# Patient Record
Sex: Male | Born: 1950 | Race: White | Hispanic: No | Marital: Married | Smoking: Former smoker
Health system: Southern US, Community
[De-identification: ages and names within clinical notes are randomized; demographics above are authoritative.]

## PROBLEM LIST (undated history)

## (undated) DIAGNOSIS — I2510 Atherosclerotic heart disease of native coronary artery without angina pectoris: Secondary | ICD-10-CM

## (undated) DIAGNOSIS — N289 Disorder of kidney and ureter, unspecified: Secondary | ICD-10-CM

## (undated) HISTORY — PX: KIDNEY SURGERY: SHX687

## (undated) HISTORY — PX: BACK SURGERY: SHX140

## (undated) HISTORY — PX: CARDIAC SURGERY: SHX584

---

## 2004-10-25 ENCOUNTER — Observation Stay: Payer: Self-pay | Admitting: Urology

## 2004-11-04 ENCOUNTER — Ambulatory Visit: Payer: Self-pay | Admitting: Urology

## 2004-11-18 ENCOUNTER — Ambulatory Visit: Payer: Self-pay | Admitting: Urology

## 2004-12-08 ENCOUNTER — Encounter: Payer: Self-pay | Admitting: Family Medicine

## 2005-03-01 ENCOUNTER — Ambulatory Visit: Payer: Self-pay | Admitting: Unknown Physician Specialty

## 2005-08-23 ENCOUNTER — Encounter: Payer: Self-pay | Admitting: Family Medicine

## 2005-08-25 ENCOUNTER — Ambulatory Visit: Payer: Self-pay | Admitting: Urology

## 2006-03-23 ENCOUNTER — Encounter: Payer: Self-pay | Admitting: Family Medicine

## 2006-12-07 ENCOUNTER — Ambulatory Visit: Payer: Self-pay | Admitting: Family Medicine

## 2006-12-07 DIAGNOSIS — Z87891 Personal history of nicotine dependence: Secondary | ICD-10-CM | POA: Insufficient documentation

## 2006-12-12 ENCOUNTER — Encounter: Payer: Self-pay | Admitting: Family Medicine

## 2008-03-10 ENCOUNTER — Ambulatory Visit: Payer: Self-pay | Admitting: Urology

## 2008-03-13 ENCOUNTER — Ambulatory Visit: Payer: Self-pay | Admitting: Urology

## 2009-01-07 ENCOUNTER — Ambulatory Visit: Payer: Self-pay | Admitting: Urology

## 2009-08-11 ENCOUNTER — Ambulatory Visit: Payer: Self-pay | Admitting: Family Medicine

## 2009-08-11 DIAGNOSIS — J069 Acute upper respiratory infection, unspecified: Secondary | ICD-10-CM | POA: Insufficient documentation

## 2009-08-11 DIAGNOSIS — F341 Dysthymic disorder: Secondary | ICD-10-CM

## 2009-09-22 ENCOUNTER — Ambulatory Visit: Payer: Self-pay | Admitting: Family Medicine

## 2009-10-18 IMAGING — CT CT ABDOMEN AND PELVIS WITHOUT AND WITH CONTRAST
3 of 6 series · 13 of 32 positions shown, 18 images · non-contrast
Comparison: none

REASON FOR EXAM: kidney stone  RLQ abd pain
COMMENTS:

PROCEDURE:     CT  - CT ABDOMEN / PELVIS  W/WO  - January 07, 2009  [DATE]
RESULT:     Triphasic CT of the abdomen and pelvis, including extended
delays with the patient in a prone position, is compared to previous studies
dated 03/13/2008 as well as 03/10/2008.

[Series 2: soft tissue w/o · axial · non-contrast · 0.78mm/px · z∈[-52,+268]mm · 5 of 96 slices shown, 10 images]
[im 16/96  soft-tissue]
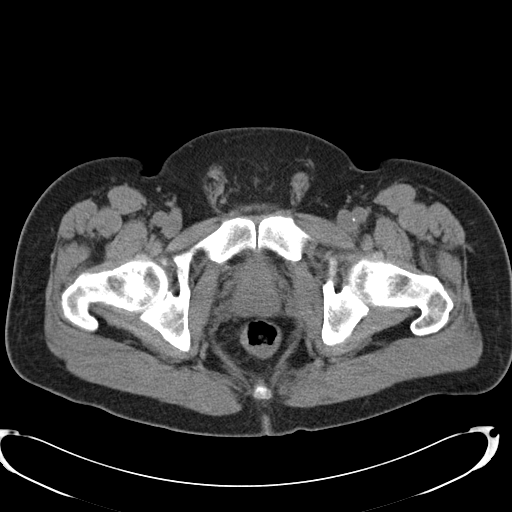
[im 16/96  bone]
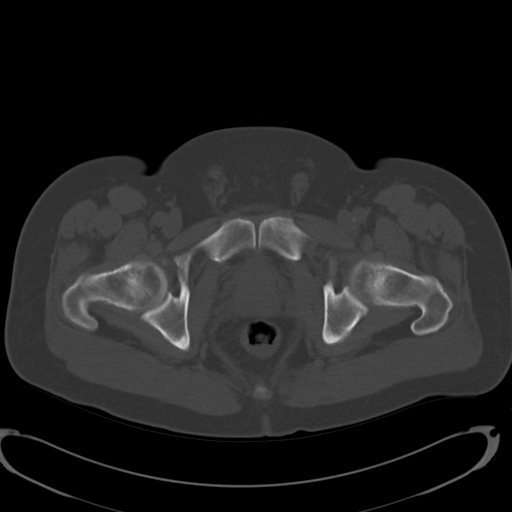
[im 32/96  soft-tissue]
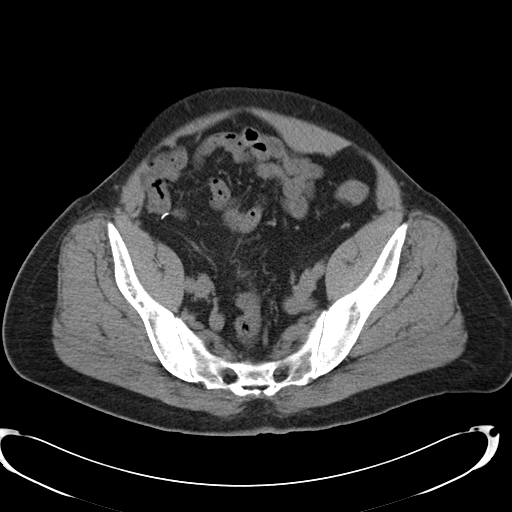
[im 32/96  lung]
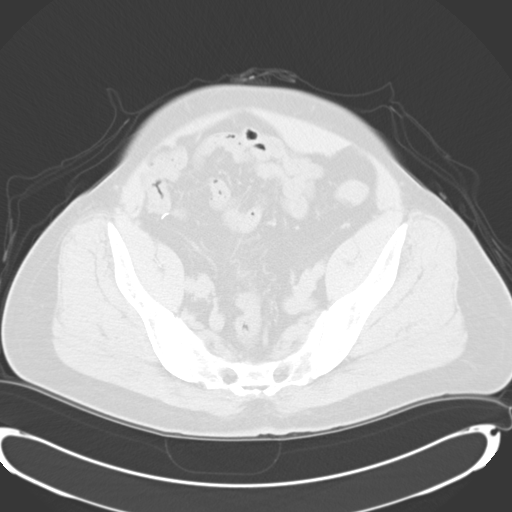
[im 48/96  soft-tissue]
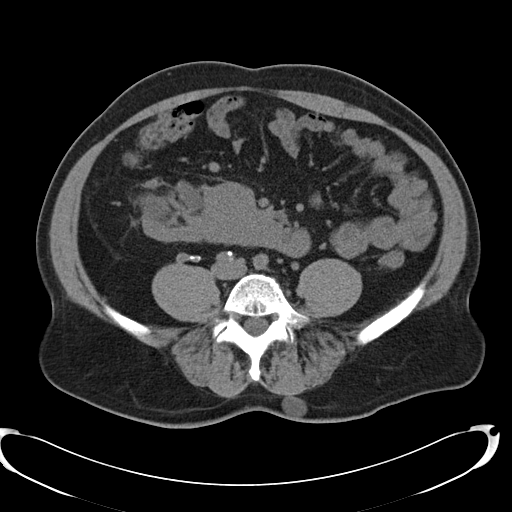
[im 48/96  lung]
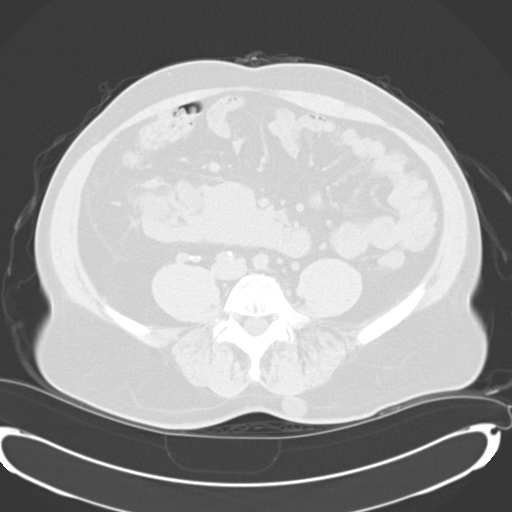
[im 64/96  soft-tissue]
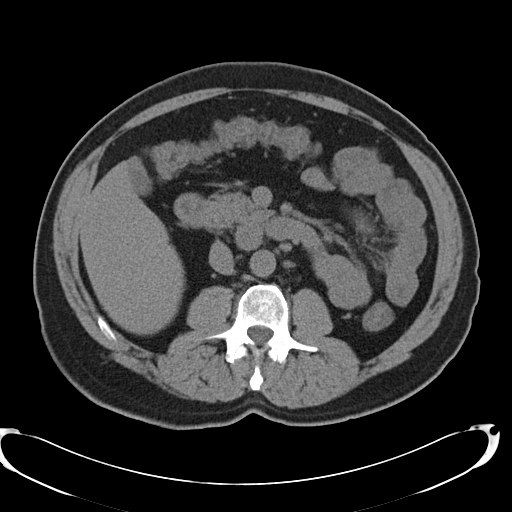
[im 64/96  lung]
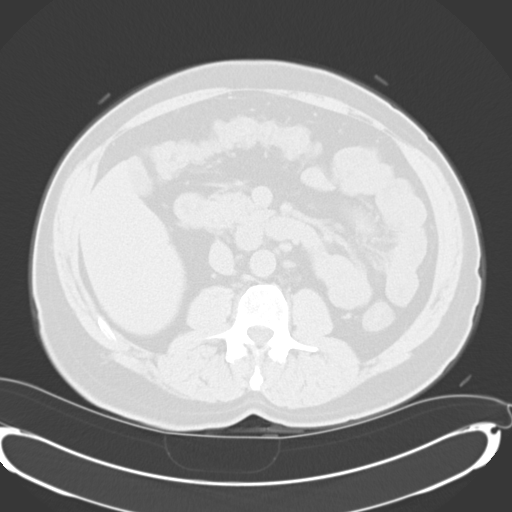
[im 80/96  soft-tissue]
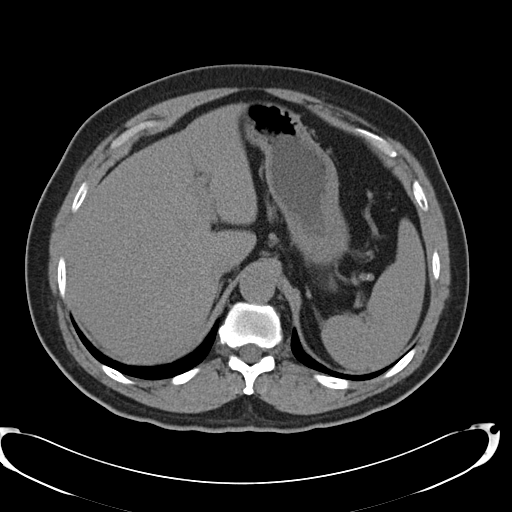
[im 80/96  lung]
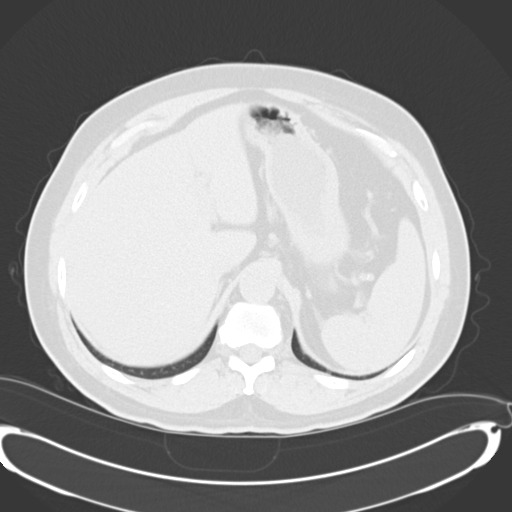

[Series 4: soft tissue with · axial · 0.78mm/px · z∈[-32,+252]mm · 4 of 96 slices shown]
[im 20/96  soft-tissue]
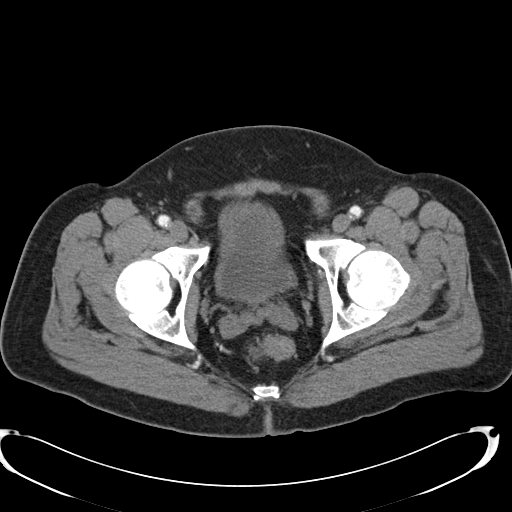
[im 39/96  soft-tissue]
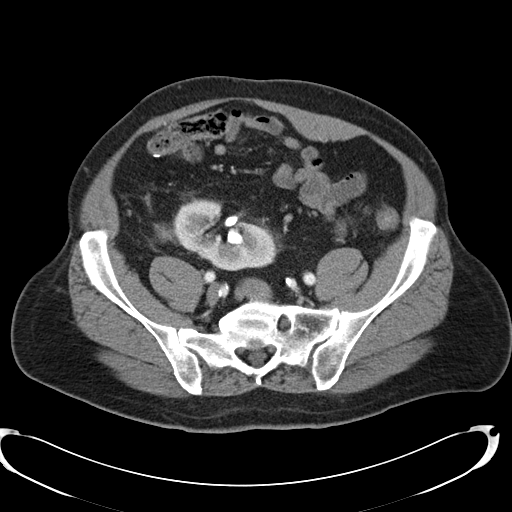
[im 58/96  soft-tissue]
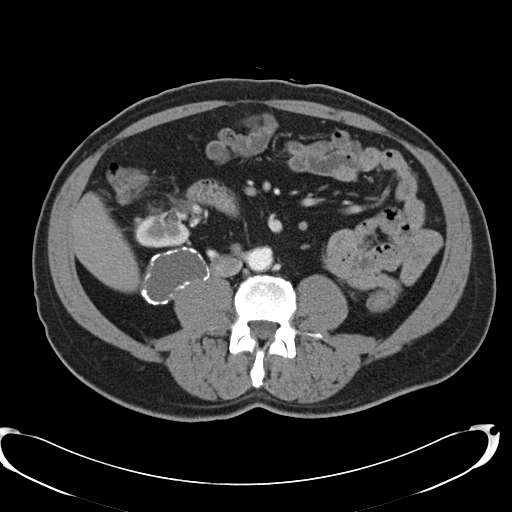
[im 77/96  soft-tissue]
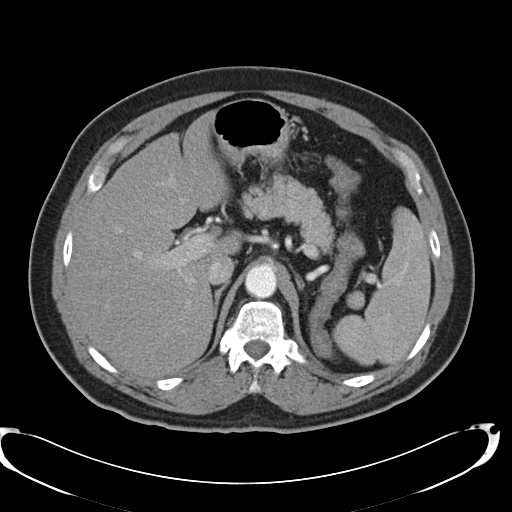

[Series 6: soft tissue delay · axial · delayed · 0.78mm/px · z∈[-32,+252]mm · 4 of 96 slices shown]
[im 20/96  soft-tissue]
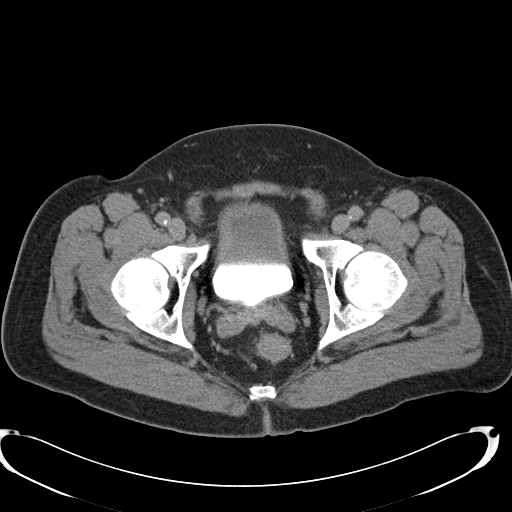
[im 39/96  soft-tissue]
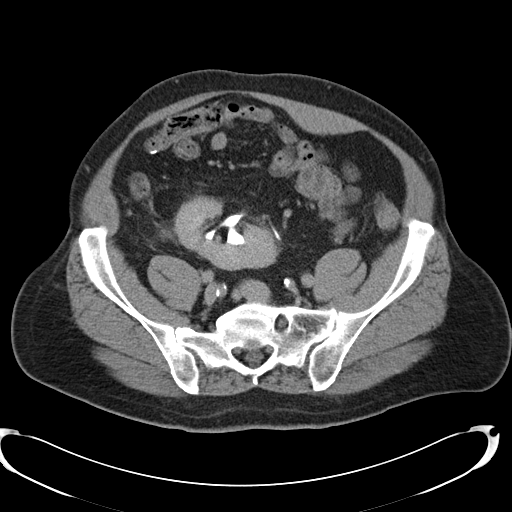
[im 58/96  soft-tissue]
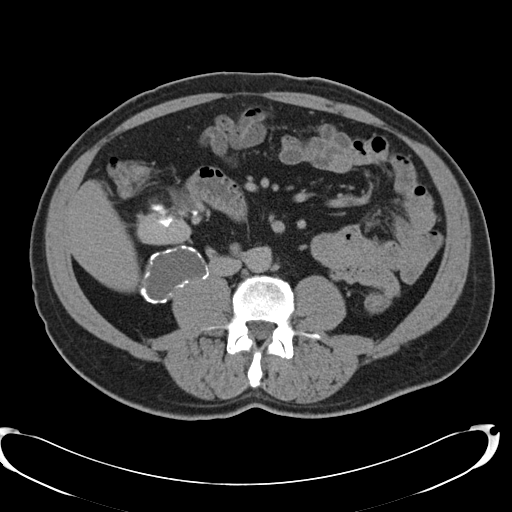
[im 77/96  soft-tissue]
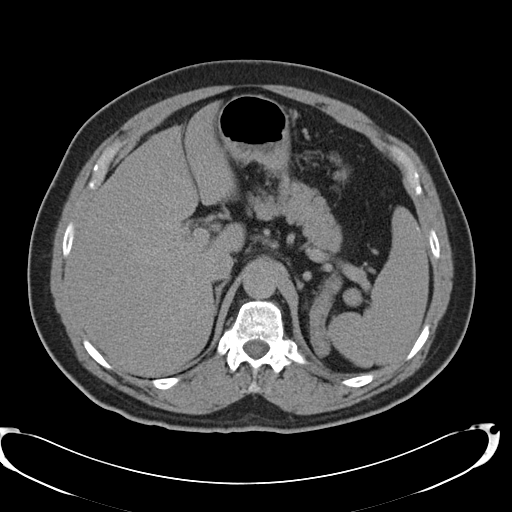

[13 of 32 positions shown; findings below may reference images not displayed]

FINDINGS: The precontrast images demonstrate what appears to be a single
renal moiety in the pelvis representative of possible crossed fused ectopia
or horseshoe variant. There are multiple stones present with the largest
measuring up to approximately 12 x 9 mm in the more inferior posterior
aspect of the renal collecting system to the left. There is mild dilation of
the renal collecting system toward the right. There appear to be separate
ureters. There is no hydroureter or definite ureteral calculus evident. No
bladder calculi are demonstrated. There is a fluid filled peripherally
calcified lesion along the right psoas muscle posterior to the right renal
moiety which is unchanged compared to the previous study. Again, this may
represent a seroma or chronic change from a hematoma. A urinoma is felt to
be less likely. Surgical clips are present in the right abdomen and pelvis
in the region of the right side of the kidney. Granulomatous calcifications
are present within the spleen. The aorta is normal in caliber with some mild
to moderate vascular calcification present.

Images obtained following intravenous administration of 100 ml of Nsovue-Q11
iodinated intravenous contrast demonstrate enhancement by the kidney.
Delayed images show excretion of contrast bilaterally. The right ureter
opacifies poorly but does not appear to be obstructed or definitely
containing a stone. There is some mild thickening of the wall of the sigmoid
colon without definite pericolonic inflammation. Diverticulosis is present.
There is some minimal perirenal stranding especially over the right side of
the kidney. There is no abnormal bowel distention, free fluid or other
abnormal fluid collection. There is no adenopathy. The urinary bladder shows
no evidence of a mass.
IMPRESSION: 1.  Pelvic kidney with an appearance suggestive of crossed fused ectopia. A
horseshoe variant is a possibility. The right renal collecting system shows
mild dilation which may be sequelae from possible ureteropelvic junction
given the previous surgical history. The right ureter is nondistended. There
are no stones in either ureter but there are multiple stones in the kidney
as described. There is some mild stranding around the right side of the
kidney. Correlate for urinary tract infection.
2.  Scattered colonic diverticulosis with some mild thickening of the wall
of the sigmoid colon. This is nonspecific. Correlate clinically. No discrete
mass is evident.
3.  Fluid collection and peripherally calcified rim appears stable anterior
to the right psoas muscle.
4.  Granulomatous changes are present.
5.  The gallbladder is present without definite stones. The gallbladder does
not appear to be significantly distended.

## 2010-01-05 ENCOUNTER — Encounter (INDEPENDENT_AMBULATORY_CARE_PROVIDER_SITE_OTHER): Payer: Self-pay | Admitting: *Deleted

## 2010-06-30 NOTE — Letter (Signed)
Summary: Nadara Eaton letter  [facility]  [address] [place], [place] [address]   Phone: [phone]  Fax: [phone]       01/05/2010 MRN: [id number]  Tommy [name] [address] [place], [place]  [address]  Dear Mr. BLAKEMAN,  [facility] [place], and [facility] announce the retirement of Arta Silence, M.D., from full-time practice at the [facility] office effective November 26, 2009 and his plans of returning part-time.  It is important to Dr. Hetty Ely and to our practice that you understand that [facility] has seven physicians in our office for your health care needs.  We will continue to offer the same exceptional care that you have today.    Dr. Hetty Ely has spoken to many of you about his plans for retirement and returning part-time in the fall.   We will continue to work with you through the transition to schedule appointments for you in the office and meet the high standards that [place] is committed to.   Again, it is with great pleasure that we share the news that Dr. Hetty Ely will return to [facility] in October of 2011 with a reduced schedule.    If you have any questions, or would like to request an appointment with one of our physicians, please call us at [phone] and press the option for Scheduling an appointment.  We take pleasure in providing you with excellent patient care and look forward to seeing you at your next office visit.  Our [facility] Physicians are:  Tillman Abide, M.D. Laurita Quint, M.D. Roxy Manns, M.D. Kerby Nora, M.D. Hannah Beat, M.D. Ruthe Mannan, M.D. We proudly welcomed Raechel Ache, M.D. and Eustaquio Boyden, M.D. to the practice in July/August 2011.  Sincerely,  [facility]

## 2010-06-30 NOTE — Assessment & Plan Note (Signed)
Summary: head,body aches/ alc   Vital Signs:  Patient profile:   60 year old male Height:      69.75 inches Weight:      209.75 pounds BMI:     30.42 Temp:     98.6 degrees F oral Pulse rate:   88 / minute Pulse rhythm:   regular BP sitting:   114 / 78  (left arm) Cuff size:   regular  Vitals Entered By: Sydell Axon LPN (August 11, 2009 10:35 AM) CC: Head and body aches, needle pains in feet, unable to sleep, under a lot of stress, lost his job and unable to find one   History of Present Illness: Pt here for depression. He has lost his job. That happened in Nov. He has continued to look for a job without luck,. He has five children, all out of the home. He passes lots of gas. His guts are all messed up. He woke himself up a few days ago crying in his sleep. He is not sleeping well. He is able to talk with his wife.   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 1 3 or 4 Times A Week 2)  Chantix Starting Month Pak 0.5 Mg X 11 & 1 Mg X 42  Misc (Varenicline Tartrate) .... As Directed 3)  Chantix 1 Mg  Tabs (Varenicline Tartrate) .... One Tab By Mouth Bid  Allergies: No Known Drug Allergies  Physical Exam  General:  Well-developed,well-nourished,in no acute distress; alert,appropriate and cooperative throughout examination Head:  Normocephalic and atraumatic without obvious abnormalities. No apparent alopecia or balding. Sinuses NT. Eyes:  Conjunctiva clear bilaterally.  Ears:  External ear exam shows no significant lesions or deformities.  Otoscopic examination reveals clear canals, tympanic membranes are intact bilaterally without bulging, retraction, inflammation or discharge. Hearing is grossly normal bilaterally. Nose:  External nasal examination shows no  deformity or inflammation. Nasal mucosa are pink and moist without lesions or exudates. Mouth:  Oral mucosa and oropharynx without lesions or exudates.  Teeth in good repair. Neck:  No deformities, masses, or tenderness noted. Lungs:  Normal respiratory effort, chest expands symmetrically. Lungs are clear to auscultation, no crackles or wheezes. Heart:  Normal rate and regular rhythm. S1 and S2 normal without gallop, murmur, click, rub or other extra sounds. Abdomen:  Bowel sounds positive,abdomen soft and non-tender without masses, organomegaly or hernias noted. Psych:  Cognition and judgment appear intact. Alert and cooperative with normal attention span and concentration. No apparent delusions, illusions, hallucinations, mild flat affect. Denies SI, HI.   Impression & Recommendations:  Problem # 1:  DEPRESSION/ANXIETY (ICD-300.4) Assessment New Start regular aerobic exercise, wotrking up to  one hour causing deep breathing  and sweating. Use sleep hygiene techniques discussed and on chart. If needed, start Zoloft 50mg , 1/2 a day for one week then whokle pill.  Problem # 2:  URI (ICD-465.9) Assessment: New  See instructions.  Instructed on symptomatic treatment. Call if symptoms persist or worsen.   Complete Medication List: 1)  Centrum Silver Tabs (Multiple vitamins-minerals) .... Take one by mouth daily 2)  Zoloft 50 Mg Tabs (Sertraline hcl) .... 1./2 tab by mouth once daily for one week, then incr to whole pill daily.  Patient Instructions: 1)  Take Guaifenesin by [facility] or [facility] and getting MUCOUS RELIEF EXPECTORANT (400mg ), take 11/2 tabs by mouth AM and NOON. 2)  Drink lots of fluids anytime taking Guaifenesin. 3)  Take Tyl Es 2 tabs by mouth three times a day regularly for a week or more. 4)  Keep lozenge in  mouth. 5)  RTC 6 weeks. 6)  Start Sertraline as needed for anxiety/depression. Prescriptions: ZOLOFT 50 MG TABS (SERTRALINE HCL) 1./2  tab by mouth once daily for one week, then incr to whole pill daily.  #30 x 12   Entered and Authorized by:   Shaune Leeks MD   Signed by:   Shaune Leeks MD on 08/11/2009   Method used:   [facility]ronically to        [facility] [employer] [phone]* (retail)       [address] [place], [place]  [address]       Ph: [phone] or [phone]       Fax: [phone]   RxID:   [id number]   Current Allergies (reviewed today): No known allergies

## 2011-02-14 ENCOUNTER — Emergency Department: Payer: Self-pay | Admitting: Emergency Medicine

## 2011-08-18 ENCOUNTER — Ambulatory Visit: Payer: Self-pay | Admitting: Sports Medicine

## 2011-08-19 ENCOUNTER — Ambulatory Visit: Payer: Self-pay | Admitting: Unknown Physician Specialty

## 2011-08-19 LAB — URINALYSIS, COMPLETE
Bacteria: NONE SEEN
Glucose,UR: NEGATIVE mg/dL (ref 0–75)
Leukocyte Esterase: NEGATIVE
Nitrite: NEGATIVE
RBC,UR: 12 /HPF (ref 0–5)
Specific Gravity: 1.016 (ref 1.003–1.030)
WBC UR: 1 /HPF (ref 0–5)

## 2011-08-19 LAB — BASIC METABOLIC PANEL
Anion Gap: 6 — ABNORMAL LOW (ref 7–16)
BUN: 12 mg/dL (ref 7–18)
Chloride: 102 mmol/L (ref 98–107)
Co2: 30 mmol/L (ref 21–32)
Creatinine: 0.98 mg/dL (ref 0.60–1.30)
EGFR (African American): >60
EGFR (Non-African Amer.): >60
Glucose: 96 mg/dL (ref 65–99)
Osmolality: 275 (ref 275–301)
Sodium: 138 mmol/L (ref 136–145)

## 2011-08-19 LAB — CBC
HCT: 44 % (ref 40.0–52.0)
MCHC: 33.2 g/dL (ref 32.0–36.0)
MCV: 94 fL (ref 80–100)
RDW: 14 % (ref 11.5–14.5)

## 2011-08-19 LAB — MRSA PCR SCREENING

## 2011-08-22 ENCOUNTER — Inpatient Hospital Stay: Payer: Self-pay | Admitting: Orthopedic Surgery

## 2011-08-22 LAB — BASIC METABOLIC PANEL
Anion Gap: 7 (ref 7–16)
BUN: 11 mg/dL (ref 7–18)
Chloride: 106 mmol/L (ref 98–107)
Creatinine: 1.21 mg/dL (ref 0.60–1.30)
EGFR (African American): >60
EGFR (Non-African Amer.): >60
Glucose: 203 mg/dL — ABNORMAL HIGH (ref 65–99)
Osmolality: 288 (ref 275–301)
Potassium: 4.6 mmol/L (ref 3.5–5.1)
Sodium: 142 mmol/L (ref 136–145)

## 2011-08-22 LAB — CBC WITH DIFFERENTIAL/PLATELET
Basophil #: 0 10*3/uL (ref 0.0–0.1)
Eosinophil #: 0.1 10*3/uL (ref 0.0–0.7)
Lymphocyte #: 0.8 10*3/uL — ABNORMAL LOW (ref 1.0–3.6)
MCH: 31.6 pg (ref 26.0–34.0)
Monocyte #: 0.5 10*3/uL (ref 0.0–0.7)
Monocyte %: 5.2 %
Neutrophil #: 7.5 10*3/uL — ABNORMAL HIGH (ref 1.4–6.5)
Neutrophil %: 84.8 %
RDW: 14.3 % (ref 11.5–14.5)
WBC: 8.8 10*3/uL (ref 3.8–10.6)

## 2011-08-22 LAB — DRUG SCREEN, URINE
Amphetamines, Ur Screen: NEGATIVE (ref ?–1000)
Barbiturates, Ur Screen: NEGATIVE (ref ?–200)
Benzodiazepine, Ur Scrn: POSITIVE (ref ?–200)
Cannabinoid 50 Ng, Ur ~~LOC~~: POSITIVE (ref ?–50)
Cocaine Metabolite,Ur ~~LOC~~: NEGATIVE (ref ?–300)
Methadone, Ur Screen: NEGATIVE (ref ?–300)
Opiate, Ur Screen: POSITIVE (ref ?–300)
Phencyclidine (PCP) Ur S: NEGATIVE (ref ?–25)
Tricyclic, Ur Screen: NEGATIVE (ref ?–1000)

## 2011-08-22 LAB — HEMOGLOBIN A1C: Hemoglobin A1C: 7.1 % — ABNORMAL HIGH (ref 4.2–6.3)

## 2011-08-22 LAB — PROTIME-INR: INR: 1.1

## 2011-08-22 LAB — TROPONIN I: Troponin-I: <0.02 ng/mL

## 2011-08-22 LAB — CK TOTAL AND CKMB (NOT AT ARMC)
CK, Total: 119 U/L (ref 35–232)
CK, Total: 158 U/L (ref 35–232)
CK-MB: 1.7 ng/mL (ref 0.5–3.6)

## 2011-08-23 LAB — CK TOTAL AND CKMB (NOT AT ARMC): CK-MB: 2.8 ng/mL (ref 0.5–3.6)

## 2011-08-23 LAB — TROPONIN I: Troponin-I: 0.21 ng/mL — ABNORMAL HIGH

## 2011-08-23 LAB — LIPID PANEL
HDL Cholesterol: 38 mg/dL — ABNORMAL LOW (ref 40–60)
Triglycerides: 110 mg/dL (ref 0–200)

## 2011-08-24 LAB — CBC WITH DIFFERENTIAL/PLATELET
Basophil #: 0 10*3/uL (ref 0.0–0.1)
Eosinophil #: 0 10*3/uL (ref 0.0–0.7)
Eosinophil %: 0 %
HGB: 11.6 g/dL — ABNORMAL LOW (ref 13.0–18.0)
Lymphocyte #: 0.5 10*3/uL — ABNORMAL LOW (ref 1.0–3.6)
MCH: 31.4 pg (ref 26.0–34.0)
MCV: 95 fL (ref 80–100)
Monocyte #: 0.2 10*3/uL (ref 0.0–0.7)
Monocyte %: 2.4 %
Neutrophil #: 8.6 10*3/uL — ABNORMAL HIGH (ref 1.4–6.5)
Neutrophil %: 92 %
RBC: 3.71 10*6/uL — ABNORMAL LOW (ref 4.40–5.90)
RDW: 14.7 % — ABNORMAL HIGH (ref 11.5–14.5)

## 2011-08-24 LAB — BASIC METABOLIC PANEL
Anion Gap: 13 (ref 7–16)
BUN: 23 mg/dL — ABNORMAL HIGH (ref 7–18)
Chloride: 109 mmol/L — ABNORMAL HIGH (ref 98–107)
Creatinine: 1.09 mg/dL (ref 0.60–1.30)
EGFR (African American): >60
EGFR (Non-African Amer.): >60
Glucose: 169 mg/dL — ABNORMAL HIGH (ref 65–99)
Osmolality: 291 (ref 275–301)
Sodium: 142 mmol/L (ref 136–145)

## 2011-08-24 LAB — MAGNESIUM
Magnesium: 1.5 mg/dL — ABNORMAL LOW
Magnesium: 2 mg/dL

## 2011-08-24 LAB — PHOSPHORUS: Phosphorus: 2.3 mg/dL — ABNORMAL LOW (ref 2.5–4.9)

## 2011-08-25 LAB — CBC WITH DIFFERENTIAL/PLATELET
Basophil #: 0 10*3/uL (ref 0.0–0.1)
Eosinophil #: 0 10*3/uL (ref 0.0–0.7)
Lymphocyte #: 0.3 10*3/uL — ABNORMAL LOW (ref 1.0–3.6)
Lymphocyte %: 3.6 %
MCH: 31.6 pg (ref 26.0–34.0)
MCHC: 33.4 g/dL (ref 32.0–36.0)
MCV: 95 fL (ref 80–100)
Monocyte %: 9.6 %
Neutrophil %: 86.8 %
RBC: 3.56 10*6/uL — ABNORMAL LOW (ref 4.40–5.90)
RDW: 14.5 % (ref 11.5–14.5)
WBC: 8.8 10*3/uL (ref 3.8–10.6)

## 2011-08-25 LAB — BASIC METABOLIC PANEL
BUN: 27 mg/dL — ABNORMAL HIGH (ref 7–18)
Calcium, Total: 7.8 mg/dL — ABNORMAL LOW (ref 8.5–10.1)
Co2: 20 mmol/L — ABNORMAL LOW (ref 21–32)
Creatinine: 0.85 mg/dL (ref 0.60–1.30)
EGFR (African American): >60
EGFR (Non-African Amer.): >60
Potassium: 4.4 mmol/L (ref 3.5–5.1)
Sodium: 144 mmol/L (ref 136–145)

## 2011-08-25 LAB — MAGNESIUM: Magnesium: 2.1 mg/dL

## 2011-08-27 LAB — BASIC METABOLIC PANEL
BUN: 16 mg/dL (ref 7–18)
Calcium, Total: 8.2 mg/dL — ABNORMAL LOW (ref 8.5–10.1)
Chloride: 107 mmol/L (ref 98–107)
Co2: 28 mmol/L (ref 21–32)
Creatinine: 0.89 mg/dL (ref 0.60–1.30)
EGFR (African American): >60
Glucose: 110 mg/dL — ABNORMAL HIGH (ref 65–99)
Osmolality: 289 (ref 275–301)
Potassium: 3.3 mmol/L — ABNORMAL LOW (ref 3.5–5.1)
Sodium: 144 mmol/L (ref 136–145)

## 2011-10-06 ENCOUNTER — Ambulatory Visit: Payer: Self-pay | Admitting: Otolaryngology

## 2011-12-13 ENCOUNTER — Ambulatory Visit: Payer: Self-pay | Admitting: Urology

## 2011-12-19 ENCOUNTER — Ambulatory Visit: Payer: Self-pay | Admitting: Urology

## 2012-01-26 ENCOUNTER — Ambulatory Visit: Payer: Self-pay | Admitting: Urology

## 2012-02-08 ENCOUNTER — Ambulatory Visit: Payer: Self-pay | Admitting: Urology

## 2012-03-16 ENCOUNTER — Ambulatory Visit: Payer: Self-pay | Admitting: Urology

## 2012-07-15 ENCOUNTER — Emergency Department: Payer: Self-pay | Admitting: Emergency Medicine

## 2012-12-21 ENCOUNTER — Ambulatory Visit: Payer: Self-pay

## 2013-09-02 ENCOUNTER — Ambulatory Visit: Payer: Self-pay | Admitting: Otolaryngology

## 2013-11-18 IMAGING — CR DG ABDOMEN 1V
1 series · 2 of 2 positions shown · non-contrast
Comparison: none

REASON FOR EXAM: Calculus
COMMENTS:

[Series 1: t abdomen supine · 0.14mm/px · 2 of 2 slices shown]
[im 1/2]
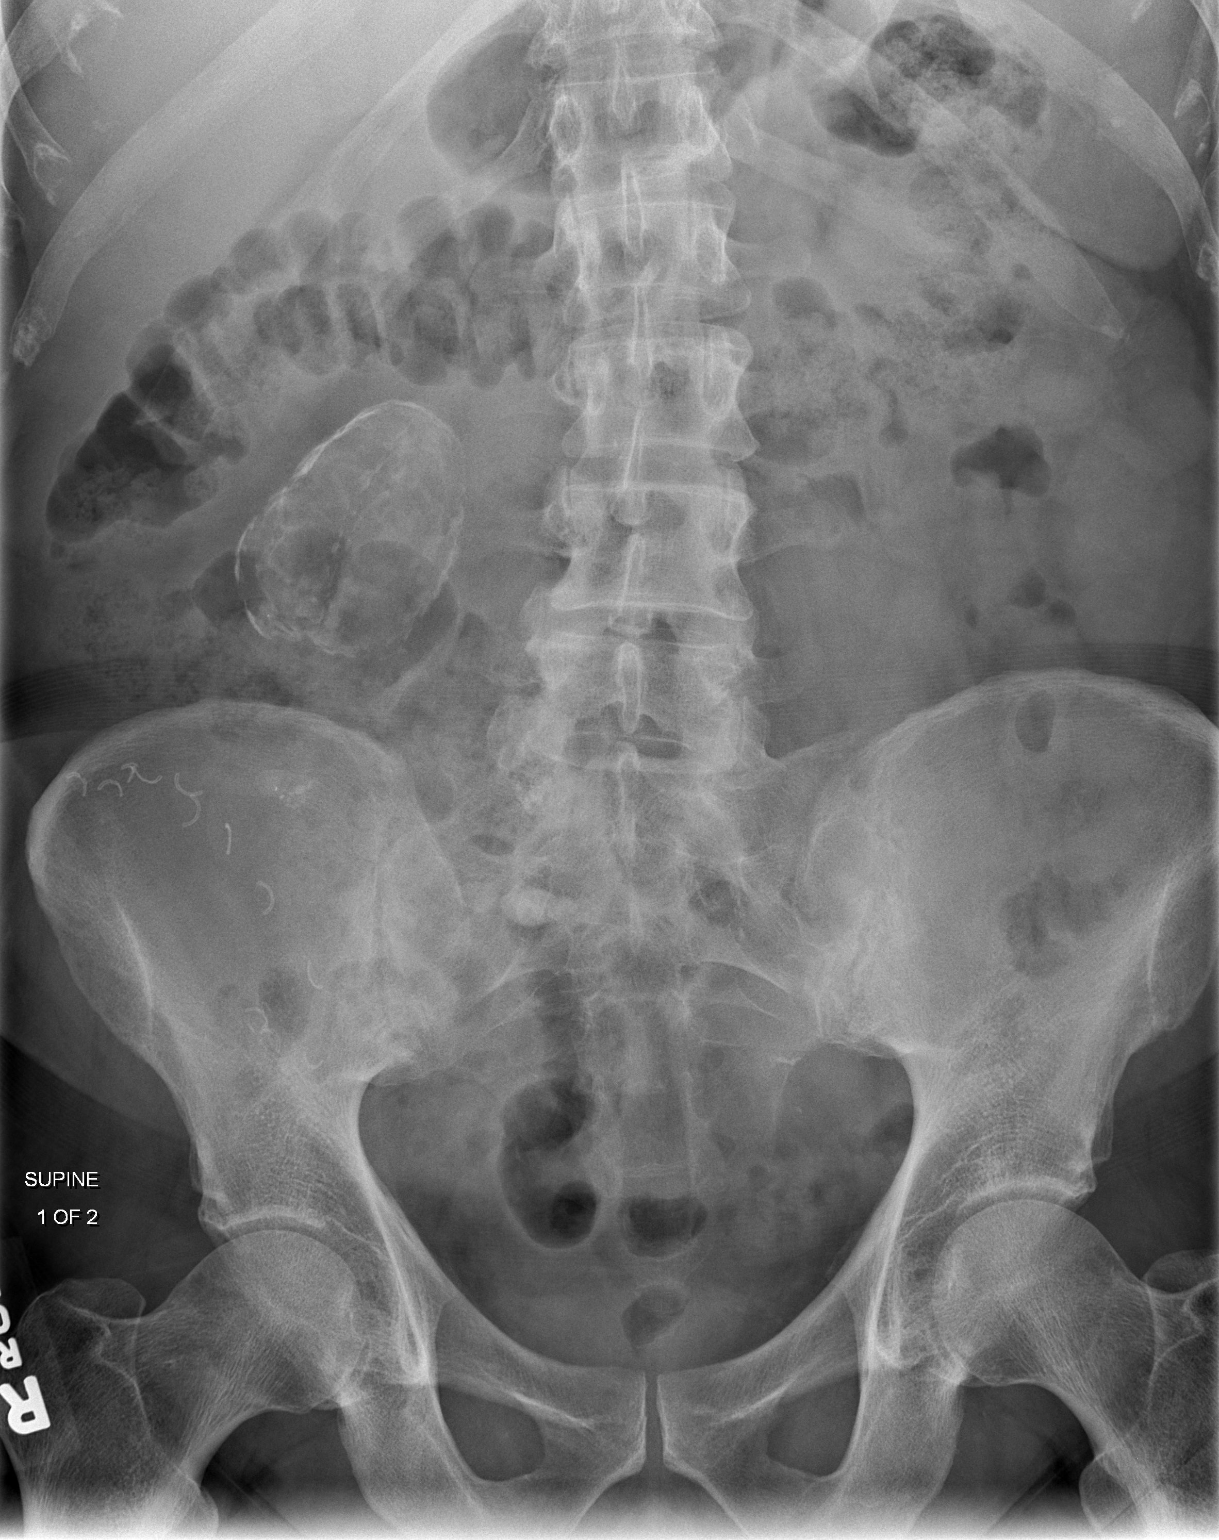
[im 2/2]
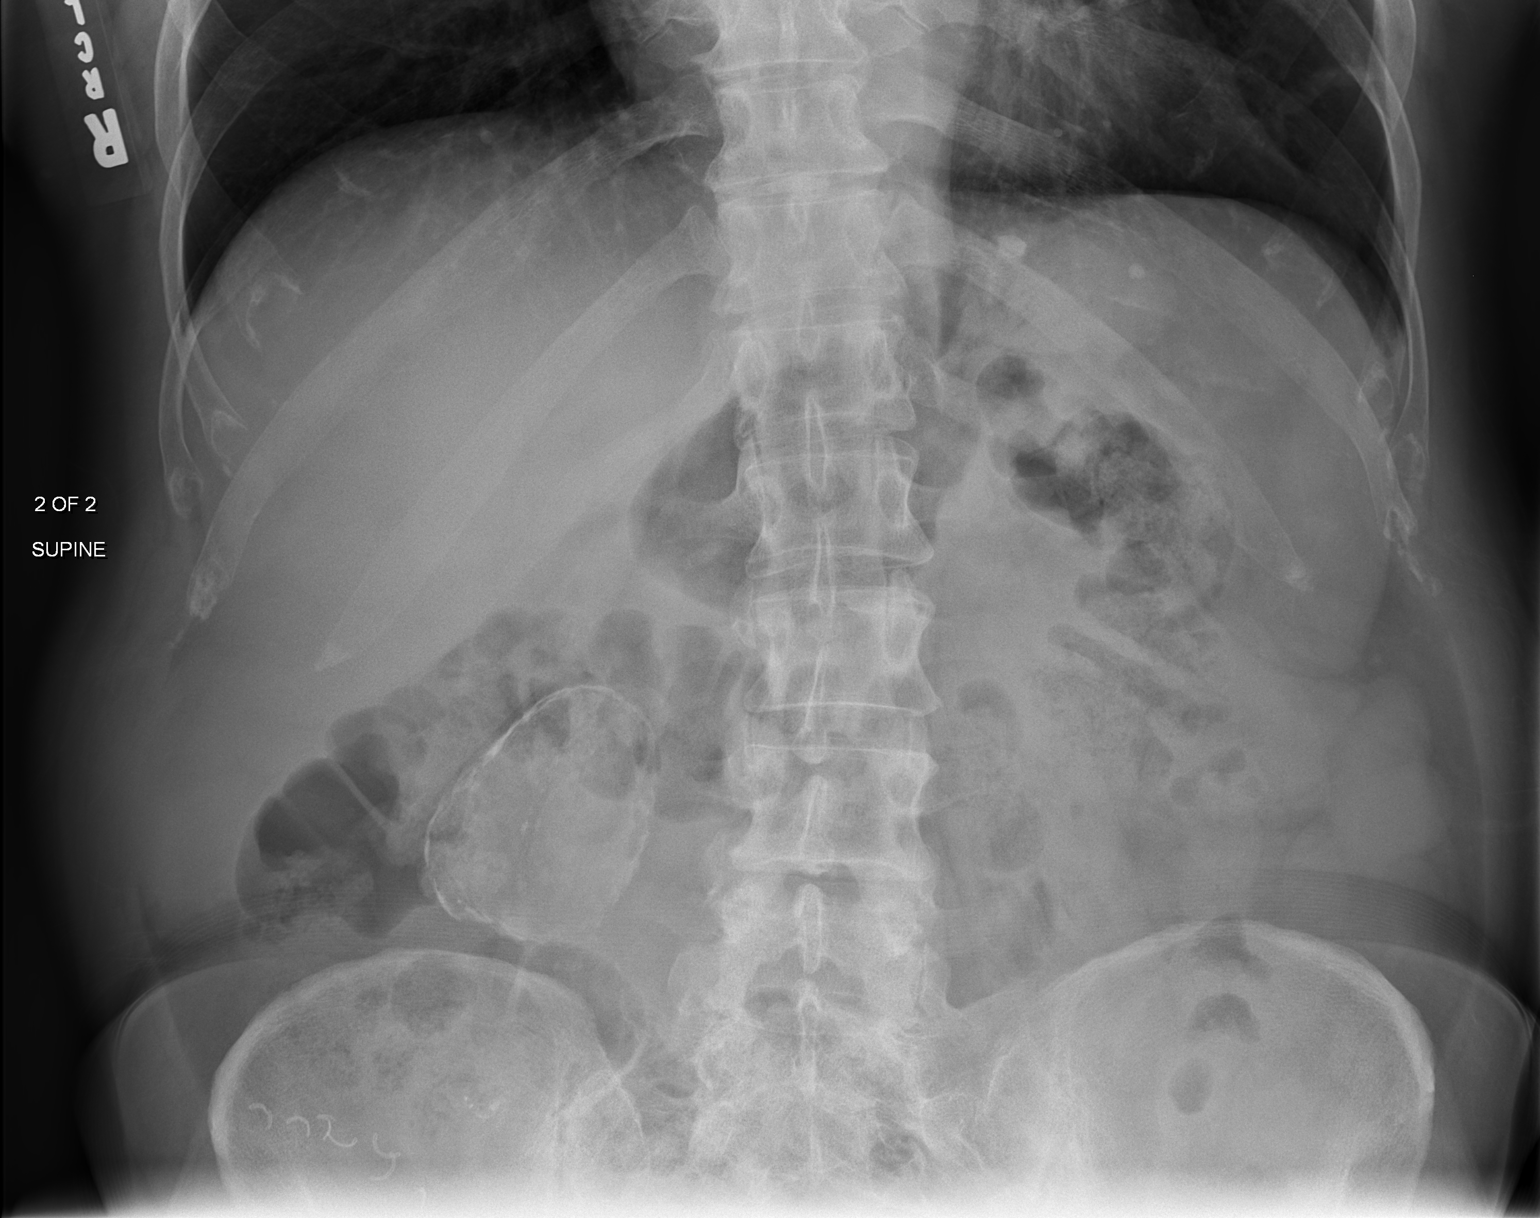

[2 of 2 positions shown; findings below may reference images not displayed]

PROCEDURE:     DXR - DXR KIDNEY URETER BLADDER  - January 26, 2012 [DATE]

RESULT:     Comparison is made to a CT of the abdomen and pelvis dated December 13, 2011 and to a previous KUB performed on December 19, 2011.

An elliptical area of peripheral calcification projects just above the right
iliac crest and may represent an old peripherally calcified hematoma or
seroma. Chronic abscess is felt to be unlikely. The patient has a known
single kidney in the right side of the pelvis to mid pelvic region with
extensive calcifications consistent with stones. This has an appearance
suggestive of crossed-fused renal ectopia on a previous CT. Surgical clips
are seen in the right pelvis. The bowel gas pattern is unremarkable. The
calcifications seen on CT are less well demonstrated on the radiograph but
there are calcifications projecting over the right sacrum and sacroiliac
region that appear to be consistent with the previously demonstrated
nephrolithiasis.
IMPRESSION: 1.     Stable appearance.
2.     Single pelvic kidney with calcifications consistent with large
stones.
3.     Probable peripherally calcified hematoma or seroma in the right
abdominal region.

[REDACTED]

## 2014-07-23 ENCOUNTER — Ambulatory Visit: Payer: Self-pay | Admitting: Otolaryngology

## 2014-09-21 NOTE — Consult Note (Signed)
PATIENT NAME:  Tommy Tommy [name], Tommy Tommy [name] MR#:  Tommy [name] DATE OF BIRTH:  1951-05-26  DATE OF CONSULTATION:  08/22/2011  REFERRING PHYSICIAN:  Dr. Belia Tommy [name]  CONSULTING PHYSICIAN:  Tommy Ruddreighton C. Jeriyah Granlund, MD  REASON FOR CONSULTATION: Acute respiratory failure.   HISTORY OF PRESENT ILLNESS: Patient is Tommy [name] with history of chronic back problems who underwent lumbar laminectomy and in postoperative PAC-U patient became stridorous after extubation and experienced cardiac arrest. Cardiopulmonary resuscitation and emergent intubation was performed. Reintubation was significantly difficult and required multiple attempts with Tommy [name] very limited view with the glide laryngoscope. I was called and asked for evaluation of his airway given the unknown reason for his stridulous airway following procedure.   PAST MEDICAL HISTORY:  1. Chronic back pain. 2. Nephrolithiasis. 3. Tobacco abuse.   PAST SURGICAL HISTORY: Lumbar laminectomy.   SOCIAL HISTORY: Patient has Tommy [name] history of tobacco use. Denies any significant alcohol use.   FAMILY HISTORY: Noncontributory.   ALLERGIES: No known drug allergies.   CURRENT MEDICATIONS: Reviewed and documented in the electronic medical record.   PHYSICAL EXAMINATION:  GENERAL: He is sedated and intubated. ET tube is secure.   HEENT: Oral cavity and oropharynx reveals no evidence of active bleeding. Endotracheal tube is in place and secure.   NECK: Neck reveals good thyroid landmarks. There is no evidence of any air or fluid collection in the neck.   HEART: Regular rate and rhythm.   IMPRESSION: 64 year old male with history of chronic back pain and stridor following Tommy [name] laminectomy who went into cardiac arrest with Tommy [name] difficult airway.   PLAN: I discussed with patient's wife as well as Dr. Belia Tommy [name] regarding the patient's respiratory failure as well as difficult airway. I have recommended giving Tommy [name] few days of steroids for reduction of any swelling and edema from Tommy [name] difficult  intubation as well as Tommy [name] difficult reintubation and then an airway evaluation on Thursday. I discussed the benefits as well as the risks of this including Tommy [name] possible emergent tracheostomy tube placement if patient fails extubation again. Also discussed with the wife that if the patient continues to have significant amount of edema I would hold off and do Tommy [name] repeat evaluation on 04/01 with Tommy [name] possible trach at that time. Patient's wife demonstrates understanding and we will proceed with planning suspension microlaryngoscopy with evaluation of his airway on Thursday, 08/25/2011.   ____________________________ Tommy Ruddreighton C. Sharnita Bogucki, MD ccv:cms D: 08/22/2011 15:29:38 ET T: 08/22/2011 16:42:19 ET JOB#: [id number]  cc: Tommy Ruddreighton C. Kizer Nobbe, MD, <Dictator>  Tommy RuddREIGHTON C Amelia Burgard MD ELECTRONICALLY SIGNED 08/29/2011 9:58

## 2014-09-21 NOTE — Op Note (Signed)
PATIENT NAME:  Tommy [name], Tommy [name] MR#:  [id number] DATE OF BIRTH:  [birth year]  DATE OF PROCEDURE:  08/25/2011  PREOPERATIVE DIAGNOSIS: Respiratory failure.   POSTOPERATIVE DIAGNOSIS: Respiratory failure.   PROCEDURE PERFORMED: Suspension microlaryngoscopy with extubation in OR.   SURGEON: Kyung Ruddreighton C. Madi Bonfiglio, MD  ANESTHESIA: General endotracheal.   ESTIMATED BLOOD LOSS: Zero.   IV FLUIDS: Please see anesthesia record.   COMPLICATIONS: None.   DRAINS/STENT PLACEMENTS: None.   SPECIMENS: None.   INDICATIONS FOR PROCEDURE: Patient is [name] who underwent laminectomy on 08/22/2011. In the PAC-U patient had acute onset of stridor and respiratory compromise and [name] CODE was placed and [name] difficult intubation was made and patient underwent several days of IV steroids and patient presents today for evaluation under anesthesia with suspension microlaryngoscopy with possible extubation.   OPERATIVE FINDINGS: Mildly edematous epiglottis as well as arytenoids but exposable airway with anterior commissure scope as well as posterior airway with [name] Dedo laryngoscope. Airway in the area of the cords as well as the subglottic region was inspected with [name] 0 degree Hopkins rod and no significant abnormalities were identified. Airway was widely patent. Patient was extubated without complications.   DESCRIPTION OF PROCEDURE: After the patient was identified in the unit, benefits and risks of the procedure were discussed and consent was reviewed. Patient was taken to the Operating Room and moved from [name] Critical Care Unit bed to the Operating Room bed. He was rotated 90 degrees. [name] shoulder roll was placed and after infusion of propofol [name] Dedo laryngoscope was inserted into patient's oral cavity after placement of [name] tooth guard and this was without injury to teeth, lip, or gums. Visualization of patient's airway was made. This revealed [name] mildly edematous and mildly floppy epiglottis. Posterior arytenoids  were able to be identified as well as posterior aspect of his larynx. At this time Dedo laryngoscope was removed from the patient's oral cavity and an anterior commissure laryngoscope was inserted and this allowed easy evaluation of the patient's true vocal folds and anterior airway as well as subglottic airway. [name] 0 degree Hopkins rod was used for evaluation of the supraglottis and subglottis which revealed no obstruction and just [name] mildly erythematous and edematous epiglottis. At this time the laryngoscope was removed from patient's oral cavity and the patient was watched until sufficient anesthesia had worn off and the endotracheal tube was withdrawn. Patient tolerated this procedure well and was taken to PAC-U in good condition and verbalizing and sating 99% to 100% on oxygen.   ____________________________ Kyung Ruddreighton C. Pranshu Lyster, MD ccv:cms D: 08/25/2011 16:50:42 ET T: 08/25/2011 17:32:58 ET JOB#: [id number]  cc: Kyung Ruddreighton C. Thai Hemrick, MD, <Dictator> Kyung RuddREIGHTON C Jamya Starry MD ELECTRONICALLY SIGNED 08/29/2011 9:58

## 2014-09-21 NOTE — Op Note (Signed)
PATIENT NAME:  Tommy [name], [name] MR#:  [id number] DATE OF BIRTH:  [birth year]  DATE OF PROCEDURE:  08/22/2011  PREOPERATIVE DIAGNOSIS: Herniated disk L5-S1, right side, with severe radiculopathy.   POSTOPERATIVE DIAGNOSIS:  Herniated disk L5-S1, right side, with severe radiculopathy with large free fragment.  PROCEDURE: Right L5-S1 microdiskectomy, partial facetectomy and foraminotomy.   SURGEON: Winn JockJames C. Gerrit Heckaliff, MD   ASSISTANT: Cranston Neighborhris Gaines, PA-C   ANESTHESIA: General.   ESTIMATED BLOOD LOSS: 100 mL.  REPLACEMENT: 300 mL crystalloid.   DRAINS: None.   COMPLICATIONS: None in the operating room.   SPECIMEN SENT: None.   BRIEF CLINICAL NOTE AND PATHOLOGY: The patient had severe radicular pain unresponsive to conservative treatment. Work-up showed evidence of a large herniated disk. Options, risks, and benefits were discussed, and he elected to proceed with operative intervention. At the time of the procedure, there was an extremely large free fragment entering into the intervertebral foramen. There was a small compressed opening in the disk space on the right side. There was mild-to-moderate facet overgrowth.   DESCRIPTION OF PROCEDURE: Preop antibiotics, adequate general anesthesia, prone position on Wilson frame, all prominences well padded. Routine prepping and draping. Appropriate timeout was called.   A routine posterior approach was made after location was identified with a spinal needle. The fascia was opened on the right side of the spinous processes and subperiosteal dissection was performed. A self-retaining retractor was placed. The microscope was then brought into the field. The level was again identified. The soft tissue was removed posteriorly. The bur was used to perform a partial hemilaminectomy. A partial facetectomy was performed to decompress laterally. The disk material was readily encountered. It was severely compressing the S1 nerve root. The disk material was  removed, it was inferior to the disk space. The tract was traced up to the opening in the posterior aspect of the annulus. This opening was very small. The incision was irrigated throughout the procedure. Hemostasis was attained with the bipolar cautery.   Lateral fluoroscopy again was used to confirm position. The exposed dura was covered with a layer of Surgiflo. The fascia was closed with #2 quill, subcutaneous closed with 0 quill, skin with staples and skin glue. A soft sterile dressing was applied. The patient was awakened and taken to the PACU having tolerated the procedure well. Sponge and needle counts were reported as correct prior to and after wound closure.   ____________________________ Winn JockJames C. Gerrit Heckaliff, MD jcc:cbb D: 08/22/2011 12:40:23 ET T: 08/22/2011 12:48:12 ET JOB#: [id number]  cc: Winn JockJames C. Gerrit Heckaliff, MD, <Dictator> Winn JockJAMES C Pallavi Clifton MD ELECTRONICALLY SIGNED 08/23/2011 5:22

## 2014-09-21 NOTE — Discharge Summary (Signed)
PATIENT NAME:  Tommy [name], Oron A MR#:  [id number] DATE OF BIRTH:  1950/12/29  DATE OF ADMISSION:  08/22/2011 DATE OF DISCHARGE:  08/27/2011  ADMITTING DIAGNOSIS: Herniated disc L5-S1, right side, with severe radiculopathy.  POSTOPERATIVE DIAGNOSIS:  Herniated disc L5-S1, right side, with severe radiculopathy with large free fragment.  PROCEDURE:  Right L5-S1 microdiskectomy, partial facetectomy and foraminotomy.  SURGEON:  Ruthann CancerJames Califf, MD  ASSISTANT:  Cranston Neighborhris Jodene Polyak, PA-C  ANESTHESIA:  General.  ESTIMATED BLOOD LOSS:  100 mL.  REPLACEMENT:  300 mL crystalloid.  DRAINS:  None.  COMPLICATIONS:  None in the operating room.  SPECIMENS SENT:  None.  BRIEF CLINICAL NOTE AND PATHOLOGY:  The patient had severe radicular pain that was unresponsive to conservative treatment.  Work-up showed evidence of a large herniated disc.  Options, risks, and benefits were discussed and he elected to proceed with operative intervention. At the time of the procedure there was an extremely large free fragment entering into the intervertebral foramen.  There was a small compressed opening of the disc space on the right side.  There was mild to moderate facet overgrowth.  PHYSICAL EXAMINATION: GENERAL:  Alert, pleasant, in no acute distress.  Very uncomfortable throughout the exam.  He ambulates with an antalgic gait pattern.  HEART:  Regular rate and rhythm.  No murmurs or gallops.  LUNGS:  Clear to auscultation bilaterally.  No wheezes, rales, rhonchi, or rubs.  LUMBOSACRAL SPINE:  No real tenderness to palpation over all the vertebral bodies and facets.  A little tender in the paraspinous muscle area of the lumbar region with no significant paraspinous muscle spasms.  Forward flexion reveals he is very stiff.  He can only get to about the knees with forward flexion and has some discomfort.  Extension about 10 degrees and also discomfort.  Lateral side bend reveals a lot of stiffness.  Rotation is actually within  normal limits.  Gait pattern is also normal.  Reflexes are 1 to 2+ at the patellar tendon on the right, 2+ at the Achilles tendon on the right.  He is 1 to 2+ at the patellar tendon on the left and trace at the Achilles tendon on the left.  He has some weak core strength but nothing focal on examination.  Normal sensation, 2+ pulses distally.  Bilateral hips good internal and external rotation acceptable. No deficits when compared to the contralateral side.  Patricks test is negative.  HOSPITAL COURSE:   After initial admission on 08/22/2011 the patient had surgery that same day.  The patient was taken to the PAC-U and upon extubation the patient became quite stridorous after extubation in the PAC-U postoperatively.   The patient went into a bradycardic cardiac arrest.  Airway was reestablished due to the patient's stridor and respiratory failure.  The patient was stabilized and then moved to the Critical Care Unit where he was treated with steroids and had his labs monitored regularly. On 03/28 the patient was extubated.  The rest of the day he was doing well and continued to remain stable on room air.  He had no shortness of breath.  The patient was moved to the orthopedic floor where the patient remained stable.  On 08/27/2011 the patient was discharged home and was given followup with ENT physician.   CONDITION AT DISCHARGE:  Stable.  DISPOSITION:  The patient was sent home with followup with orthopedics as well as ENT.  DISCHARGE INSTRUCTIONS: 1. The patient may gradually increase weightbearing.  Avoid bending, lifting, and  twisting.  Avoid sitting upright. 2. Diabetic diet as tolerated. 3. Multivitamin once a day. Resume typical home medications. 4. Wound care:  Do not get the dressing bandage wet or dirty.  Call [facility] orthopedics if the dressing gets water under it.  The patient may shower.  Do not let water get under the dressing. 5. Please call the office for any urgent changes in the  patient's health. 6. Patient should walk as much as he can.  Do not lift, push, or pull more than ten pounds currently.  FOLLOW-UP APPOINTMENT:  Call [facility] orthopedics office, [phone], to confirm.  DISCHARGE MEDICATIONS: 1. Percocet 5/325 oral tablet, one tablet orally once every six hours as needed. 2. Albuterol MDI one puff every four to six hours as needed for shortness of breath/wheezing. 3. Chloraseptic spray, 2 sprays every two hours as needed for sore throat.   ____________________________ Evon Slack, PA-C tcg:bjt D: 08/30/2011 16:07:35 ET T: 08/31/2011 11:18:52 ET JOB#: [id number]  cc: Evon Slack, PA-C, <Dictator> Evon Slack [place] ELECTRONICALLY SIGNED 09/12/2011 14:51

## 2014-09-21 NOTE — Consult Note (Signed)
Brief Consult Note: Diagnosis: 1. Acute Resp. Failure 2. Stridor 3. hx of nephrolithasis s/p Lithotripsy 4. s/p Lumbar Laminectomy (L5-S1).   Patient was seen by consultant.   Consult note dictated.   Orders entered.   Discussed with Attending MD.   Comments: 64 yo male w/ hx of chronic back pain, hx of nephrolithasis s/p Lithotripsy, came into hospital for lumbar laminectomy but post-op went into cardiac arrest w/ difficult to get an airway and also bradycardic arrest.   1. Cardiac Arrest - post-operatively in the PACU.  pt. became quite stridorous after extubation and then went into a bradycardic cardiac arrest.  Difficult to get airway but eventually were able to get one.   - did receive some CPR and one dose of epi, atropine.   - Pulmonary consulted (Dr. Belia HemanKasa) and will cont. to follow. 2. Acute REsp. Failure - likely due to stridor in upper airway and etiology unclear.   - started on steriods by Pulm. and cont. weaning as per Pulm. (Dr. Belia HemanKasa) 3. Acidosis - acute resp. acidosis and should improve w/ vent management.  - will follow ABG. 4. Chronic back pain s/p lumbar laminectomy - cont. care as per Ortho.  5. Random BS > 200 - ?? DM.  Will check HemoglobinA1c.  Check lipid profile.  - place on insulin SS as pt. is going to be on Steroids.   Full Code  Job # [id number]  Electronic Signatures: [place] SirenSainani, Chantel Teti J (MD)  (Signed [id number]-Mar-13 14:02)  Authored: Brief Consult Note   Last Updated: 25-Mar-13 14:02 by [place] SirenSainani, Christopher Glasscock J (MD)

## 2014-09-21 NOTE — Consult Note (Signed)
PATIENT NAME:  Tommy Tommy [name], Tommy Tommy [name] MR#:  [id number] DATE OF BIRTH:  06/23/1950  DATE OF CONSULTATION:  08/22/2011  REFERRING PHYSICIAN:  Dr. Belia Tommy [name] and Dr. Gerrit Tommy [name]  CONSULTING PHYSICIAN:  Tommy PancakeVivek J. Tommy KaiserSainani, MD  CHIEF COMPLAINT: Acute respiratory failure.   HISTORY OF PRESENT ILLNESS: This is Tommy [name] who has Tommy [name] history of chronic back problems and history of nephrolithiasis status post lithotripsy who came into the hospital for Tommy [name] lumbar laminectomy. Postoperatively in the PAC-U patient became quite stridorous after extubation and went into Tommy [name] bradycardic cardiac arrest. He was given one dose of IV epinephrine, atropine and also given some cardiopulmonary resuscitation for Tommy [name] while. Airway was finally established and he was admitted to the Intensive Care Unit. Hospitalist services were contacted for medical management. Patient presently is sedated and intubated in the Intensive Care Unit and is hemodynamically stable. His oxygen sats have improved.   REVIEW OF SYSTEMS: Currently unobtainable given the patient's mental status.   PAST MEDICAL HISTORY:  1. Chronic back pain. 2. History of nephrolithiasis status post lithotripsy. 3. History of tobacco abuse.   ALLERGIES: No known drug allergies.   SOCIAL HISTORY: He used to be Tommy [name] smoker. No alcohol abuse. No illicit drug abuse. Lives at home with his wife.   FAMILY HISTORY: Currently unobtainable.   CURRENT MEDICATIONS: Percocet 5/325, 1 tablet q.6 hours as needed.    PHYSICAL EXAMINATION:  GENERAL: This is Tommy [name] critically ill-appearing patient lying in bed, but in no apparent distress.    HEENT: He is atraumatic, normocephalic. His pupils are pinpoint but minimally reactive.   NECK: Supple. There is no jugular venous distention, no bruits, no lymphadenopathy, no thyromegaly.   HEART: Regular rate, rhythm. No murmurs, no rubs, no clicks.   LUNGS: Clear to auscultation bilaterally. No rales, no rhonchi, no wheezes.   ABDOMEN: Soft, flat,  nontender, nondistended. Has good bowel sounds. No hepatosplenomegaly appreciated.   EXTREMITIES: No evidence of any cyanosis, clubbing, or peripheral edema. Has +2 pedal and radial pulses bilaterally.   NEUROLOGIC: He is sedated and intubated. Difficult to do Tommy [name] full Tommy [name] neurological exam.   SKIN: Moist, warm with no rashes.   LYMPHATIC: There is no cervical or axillary lymphadenopathy.   LABORATORY, DIAGNOSTIC AND RADIOLOGICAL DATA: White cell count 8.8, hemoglobin 12.7, hematocrit 37.9, platelet count 223, blood glucose 203, BUN 11, creatinine 1.2, sodium 142, potassium 4.6. Troponin less than 0.02, prothrombin time 14.3, INR 1.1.   ABG done initially prior to him being intubated showed pH 7.1, pCO2 80, pO2 166.   ASSESSMENT AND PLAN: This is Tommy [name] with history of chronic back pain, nephrolithiasis status post lithotripsy presented to the hospital for lumbar laminectomy and postoperatively went into cardiac arrest with respiratory failure.  1. Cardiac arrest. This happened postoperatively in the PAC-U. This was probably related to the fact that patient became quite hypoxic which led his bradycardia, cardiac arrest. Once they were able to get an airway patient has had no further evidence of cardiac arrhythmias. He currently is in normal sinus rhythm and hemodynamically stable off any pressors. His troponins have been negative. As mentioned, he did get some cardiopulmonary resuscitation and one dose of epinephrine and atropine during his cardiac arrest. For now we will follow cardiac markers and follow him hemodynamically.  2. Acute respiratory failure. This was likely secondary to the upper airway stridor. The exact etiology is unclear whether this is an allergic reaction or he has some problem with his upper airway. He has  been empirically started on steroids by pulmonary, we will continue and continue weaning off the ventilator as per pulmonary. Patient likely will benefit from an ENT  consult and one has been placed as he might need Tommy [name] direct view laryngoscopy to evaluate his upper airway.  3. Acidosis. This is acute respiratory acidosis, probably postoperatively as patient was unable to take good deep breaths and was quite stridorous. This should improve with vent management and will follow his serial ABGs.  4. Chronic back pain status post laminectomy. Continue care as per orthopedics at this time.   Thank you so much for the consultation. Will follow along with you.   CODE STATUS: Patient is Tommy [name] FULL CODE.   CRITICAL CARE TIME SPENT: 50 minutes.   ____________________________ Tommy Tommy [name]. Tommy Kaiser, MD vjs:cms D: 08/22/2011 13:52:52 ET T: 08/22/2011 14:26:18 ET JOB#: [id number]  cc: Tommy Tommy [name]. Tommy Kaiser, MD, <Dictator> Tommy Siren MD ELECTRONICALLY SIGNED 08/25/2011 10:47

## 2014-09-21 NOTE — Consult Note (Signed)
Chief Complaint:   Subjective/Chief Complaint patient extubated in OR without difficulty   Assessment/Plan:  Assessment/Plan:   Assessment s/p extubation in OR    Plan Hold on PO until patient more awake then advance slowly.  Rec. sitting upright at 30-40 degrees.  Patient most likely has some OSA and will need a sleep study as outpatient.  Rec. follow up as outpatient in 10-14 days.   Electronic Signatures: Estevan Kersh, Rayfield Citizenreighton Charles (MD)  (Signed [phone]-Mar-13 16:52)  Authored: Chief Complaint, Assessment/Plan   Last Updated: 28-Mar-13 16:52 by Flossie DibbleVaught, Mava Suares Charles (MD)

## 2014-09-21 NOTE — Consult Note (Signed)
Brief Consult Note: Diagnosis: Acute respiratory failure.   Patient was seen by consultant.   Consult note dictated.   Recommend to proceed with surgery or procedure.   Discussed with Attending MD.   Comments: Aucte respiratory failure following laminectomy.  History of tobacco abuse and difficult intubation prior to procedure.  Patient also has history of multiple procedures that required intubation with no prior similar episodes. PE: Neck- good thyroid landmarks, no cutaneous emphysema  Impression:  Acute respiratory failure/stridor with unknown cause Plan:  Wait until Thursday for airway evaluation in OR.  Steroids until then.  Discussed with wife.  Please obtain consent and hold any TFs Wed. night.  Electronic Signatures: Jostin Rue, Rayfield Citizenreighton Charles (MD)  (Signed [phone]-Mar-13 15:33)  Authored: Brief Consult Note   Last Updated: 25-Mar-13 15:33 by Flossie DibbleVaught, Fantasia Jinkins Charles (MD)

## 2014-11-04 DIAGNOSIS — F17200 Nicotine dependence, unspecified, uncomplicated: Secondary | ICD-10-CM | POA: Insufficient documentation

## 2015-01-23 DIAGNOSIS — R030 Elevated blood-pressure reading, without diagnosis of hypertension: Secondary | ICD-10-CM

## 2015-01-23 DIAGNOSIS — IMO0001 Reserved for inherently not codable concepts without codable children: Secondary | ICD-10-CM | POA: Insufficient documentation

## 2015-01-23 DIAGNOSIS — Z79899 Other long term (current) drug therapy: Secondary | ICD-10-CM | POA: Insufficient documentation

## 2015-01-27 DIAGNOSIS — I2510 Atherosclerotic heart disease of native coronary artery without angina pectoris: Secondary | ICD-10-CM | POA: Insufficient documentation

## 2015-02-20 DIAGNOSIS — Z951 Presence of aortocoronary bypass graft: Secondary | ICD-10-CM | POA: Insufficient documentation

## 2015-03-16 ENCOUNTER — Encounter: Payer: Self-pay | Admitting: *Deleted

## 2015-03-16 ENCOUNTER — Encounter: Payer: 59 | Attending: Cardiothoracic Surgery | Admitting: *Deleted

## 2015-03-16 VITALS — Ht 70.25 in | Wt 183.1 lb

## 2015-03-16 DIAGNOSIS — Z951 Presence of aortocoronary bypass graft: Secondary | ICD-10-CM | POA: Diagnosis not present

## 2015-03-16 NOTE — Patient Instructions (Signed)
Patient Instructions  Patient Details  Name: Tommy LericheDouglas Adair Dinning MRN: [id number] Date of Birth: November 29, 1950 Referring Provider:  Lottie Dawsonaranasos, Thomas G, MD  Below are the personal goals you chose as well as exercise and nutrition goals. Our goal is to help you keep on track towards obtaining and maintaining your goals. We will be discussing your progress on these goals with you throughout the program.  Initial Exercise Prescription:     Initial Exercise Prescription - 03/16/15 1400    Date of Initial Exercise Prescription   Date 03/16/15   Treadmill   MPH 3   Grade 0   Minutes 15   Bike   Level 1.4   Minutes 15   Recumbant Bike   Level 3   Watts 30   Minutes 15   NuStep   Level 4   Watts 50   Minutes 15   Arm Ergometer   Level 1   Watts 10   Minutes 10   Arm/Foot Ergometer   Level 1   Watts 15   Minutes 10   Cybex   Level 3   RPM 30   Minutes 15   Recumbant Elliptical   Level 2   Watts 25   Minutes 15   Elliptical   Level 1   Speed 3   Minutes 5   REL-XR   Level 3   Watts 30   Minutes 15   Prescription Details   Frequency (times per week) 3   Duration Progress to 30 minutes of continuous aerobic without signs/symptoms of physical distress   Intensity   THRR REST +  30   Ratings of Perceived Exertion 11-15   Progression Continue progressive overload as per policy without signs/symptoms or physical distress.   Resistance Training   Training Prescription Yes  ROM only if needed due to muscle soreness   Weight 1   Reps 10-12      Exercise Goals: Frequency: Be able to perform aerobic exercise three times per week working toward 3-5 days per week.  Intensity: Work with a perceived exertion of 11 (fairly light) - 15 (hard) as tolerated. Follow your new exercise prescription and watch for changes in prescription as you progress with the program. Changes will be reviewed with you when they are made.  Duration: You should be able to do 30 minutes of  continuous aerobic exercise in addition to a 5 minute warm-up and a 5 minute cool-down routine.  Nutrition Goals: Your personal nutrition goals will be established when you do your nutrition analysis with the dietician.  The following are nutrition guidelines to follow: Cholesterol < 200mg /day Sodium < 1500mg /day Fiber: Men over 50 yrs - 30 grams per day  Personal Goals:     Personal Goals and Risk Factors at Admission - 03/16/15 1353    Personal Goals and Risk Factors on Admission    Weight Management Yes   Intervention Learn and follow the exercise and diet guidelines while in the program. Utilize the nutrition and education classes to help gain knowledge of the diet and exercise expectations in the program  HAs been working on weight loss for diabetes prevention.   Admit Weight 183 lb (83.008 kg)   Goal Weight 185 lb (83.915 kg)   Increase Aerobic Exercise and Physical Activity Yes  Is walking 4 days a week at least 30 min.   Intervention While in program, learn and follow the exercise prescription taught. Start at a low level workload and increase workload after  able to maintain previous level for 30 minutes. Increase time before increasing intensity.   Take Less Medication Yes   Intervention Learn your risk factors and begin the lifestyle modifications for risk factor control during your time in the program.   Understand more about Heart/Pulmonary Disease. Yes   Intervention While in program utilize professionals for any questions, and attend the education sessions. Great websites to use are www.americanheart.org or www.lung.org for reliable information.   Diabetes No   Hypertension Yes   Goal Participant will see blood pressure controlled within the values of 140/27mm/Hg or within value directed by their physician.   Intervention Provide nutrition & aerobic exercise along with prescribed medications to achieve BP 140/90 or less.   Lipids Yes   Goal Cholesterol controlled with  medications as prescribed, with individualized exercise RX and with personalized nutrition plan. Value goals: LDL < , HDL > . Participant states understanding of desired cholesterol values and following prescriptions.   Intervention Provide nutrition & aerobic exercise along with prescribed medications to achieve LDL 70mg , HDL >40mg .   Stress No      Tobacco Use Initial Evaluation: History  Smoking status  . Former Smoker  . Quit date: 02/23/2015  Smokeless tobacco  . Never Used    Copy of goals given to participant.

## 2015-03-16 NOTE — Progress Notes (Signed)
Cardiac Individual Treatment Plan  Patient Details  Name: Tommy [name] MRN: [id number] Date of Birth: [birth year] Referring Provider:  Lottie Dawson, MD  Initial Encounter Date: Date: 03/16/15  Visit Diagnosis: S/P CABG x 4  Patient's Home Medications on Admission:  Current outpatient prescriptions:  .  aspirin 81 MG chewable tablet, Chew 81 mg by mouth., Disp: , Rfl:  .  atorvastatin (LIPITOR) 80 MG tablet, Take 80 mg by mouth., Disp: , Rfl:  .  metoprolol tartrate (LOPRESSOR) 25 MG tablet, Take 25 mg by mouth., Disp: , Rfl:  .  nicotine (RA NICOTINE) 7 mg/24hr patch, Place onto the skin., Disp: , Rfl:  .  oxyCODONE (OXY IR/ROXICODONE) 5 MG immediate release tablet, Take 5 mg by mouth., Disp: , Rfl:  .  Glucosamine Sulfate 500 MG TABS, Take by mouth., Disp: , Rfl:  .  Multiple Vitamin (MULTI-VITAMINS) TABS, Take by mouth., Disp: , Rfl:  .  Omega-3 1000 MG CAPS, Take by mouth., Disp: , Rfl:   Past Medical History: No past medical history on file.  Tobacco Use: History  Smoking status  . Former Smoker  . Quit date: 02/23/2015  Smokeless tobacco  . Never Used    Labs: Recent Review Flowsheet Data    Labs for ITP Cardiac and Pulmonary Rehab Latest Ref Rng 08/22/2011 08/23/2011   Cholestrol 0-200 mg/dL - 409   LDLCALC 8-119 mg/dL - 147(W)   HDL 29-56 mg/dL - 21(H)   Trlycerides 0-865 mg/dL - 784   Hemoglobin O9G 4.2-6.3 % 7.1(H) -       Exercise Target Goals: Date: 03/16/15  Exercise Program Goal: Individual exercise prescription set with THRR, safety & activity barriers. Participant demonstrates ability to understand and report RPE using BORG scale, to self-measure pulse accurately, and to acknowledge the importance of the exercise prescription.  Exercise Prescription Goal: Starting with aerobic activity 30 plus minutes a day, 3 days per week for initial exercise prescription. Provide home exercise prescription and guidelines that participant  acknowledges understanding prior to discharge.  Activity Barriers & Risk Stratification:     Activity Barriers & Risk Stratification - 03/16/15 1347    Activity Barriers & Risk Stratification   Activity Barriers Back Problems      6 Minute Walk:     6 Minute Walk      03/16/15 1412       6 Minute Walk   Phase Initial     Distance 1590 feet     Walk Time 6 minutes     Resting HR 83 bpm     Resting BP 126/74 mmHg     Max Ex. HR 102 bpm     Max Ex. BP 138/70 mmHg     RPE 11     Symptoms No        Initial Exercise Prescription:     Initial Exercise Prescription - 03/16/15 1400    Date of Initial Exercise Prescription   Date 03/16/15   Treadmill   MPH 3   Grade 0   Minutes 15   Bike   Level 1.4   Minutes 15   Recumbant Bike   Level 3   Watts 30   Minutes 15   NuStep   Level 4   Watts 50   Minutes 15   Arm Ergometer   Level 1   Watts 10   Minutes 10   Arm/Foot Ergometer   Level 1   Watts 15   Minutes 10  Cybex   Level 3   RPM 30   Minutes 15   Recumbant Elliptical   Level 2   Watts 25   Minutes 15   Elliptical   Level 1   Speed 3   Minutes 5   REL-XR   Level 3   Watts 30   Minutes 15   Prescription Details   Frequency (times per week) 3   Duration Progress to 30 minutes of continuous aerobic without signs/symptoms of physical distress   Intensity   THRR REST +  30   Ratings of Perceived Exertion 11-15   Progression Continue progressive overload as per policy without signs/symptoms or physical distress.   Resistance Training   Training Prescription Yes  ROM only if needed due to muscle soreness   Weight 1   Reps 10-12      Exercise Prescription Changes:   Discharge Exercise Prescription (Final Exercise Prescription Changes):   Nutrition:  Target Goals: Understanding of nutrition guidelines, daily intake of sodium 1500mg , cholesterol 200mg , calories 30% from fat and 7% or less from saturated fats, daily to have 5 or more  servings of fruits and vegetables.  Biometrics:     Pre Biometrics - 03/16/15 1419    Pre Biometrics   Height 5' 10.25" (1.784 m)   Weight 183 lb 1.6 oz (83.054 kg)   Waist Circumference 37 inches   Hip Circumference 40 inches   Waist to Hip Ratio 0.92 %   BMI (Calculated) 26.1       Nutrition Therapy Plan and Nutrition Goals:   Nutrition Discharge: Rate Your Plate Scores:   Nutrition Goals Re-Evaluation:   Psychosocial: Target Goals: Acknowledge presence or absence of depression, maximize coping skills, provide positive support system. Participant is able to verbalize types and ability to use techniques and skills needed for reducing stress and depression.  Initial Review & Psychosocial Screening:     Initial Psych Review & Screening - 03/16/15 1444    Screening Interventions   Interventions Encouraged to exercise      Quality of Life Scores:   PHQ-9:     Recent Review Flowsheet Data    Depression screen [facility] 2/9 03/16/2015   Decreased Interest 0   Down, Depressed, Hopeless 1   PHQ - 2 Score 1   Altered sleeping 1   Tired, decreased energy 1   Change in appetite 1   Feeling bad or failure about yourself  1   Trouble concentrating 0   Moving slowly or fidgety/restless 0   Suicidal thoughts 0   PHQ-9 Score 5   Difficult doing work/chores Not difficult at all      Psychosocial Evaluation and Intervention:   Psychosocial Re-Evaluation:   Vocational Rehabilitation: Provide vocational rehab assistance to qualifying candidates.   Vocational Rehab Evaluation & Intervention:     Vocational Rehab - 03/16/15 1350    Initial Vocational Rehab Evaluation & Intervention   Assessment shows need for Vocational Rehabilitation No      Education: Education Goals: Education classes will be provided on a weekly basis, covering required topics. Participant will state understanding/return demonstration of topics presented.  Learning Barriers/Preferences:      Learning Barriers/Preferences - 03/16/15 1350    Learning Barriers/Preferences   Learning Barriers None   Learning Preferences None      Education Topics: General Nutrition Guidelines/Fats and Fiber: -Group instruction provided by verbal, written material, models and posters to present the general guidelines for heart healthy nutrition. Gives an explanation and  review of dietary fats and fiber.   Controlling Sodium/Reading Food Labels: -Group verbal and written material supporting the discussion of sodium use in heart healthy nutrition. Review and explanation with models, verbal and written materials for utilization of the food label.   Exercise Physiology & Risk Factors: - Group verbal and written instruction with models to review the exercise physiology of the cardiovascular system and associated critical values. Details cardiovascular disease risk factors and the goals associated with each risk factor.   Aerobic Exercise & Resistance Training: - Gives group verbal and written discussion on the health impact of inactivity. On the components of aerobic and resistive training programs and the benefits of this training and how to safely progress through these programs.   Flexibility, Balance, General Exercise Guidelines: - Provides group verbal and written instruction on the benefits of flexibility and balance training programs. Provides general exercise guidelines with specific guidelines to those with heart or lung disease. Demonstration and skill practice provided.   Stress Management: - Provides group verbal and written instruction about the health risks of elevated stress, cause of high stress, and healthy ways to reduce stress.   Depression: - Provides group verbal and written instruction on the correlation between heart/lung disease and depressed mood, treatment options, and the stigmas associated with seeking treatment.   Anatomy & Physiology of the Heart: - Group verbal  and written instruction and models provide basic cardiac anatomy and physiology, with the coronary electrical and arterial systems. Review of: AMI, Angina, Valve disease, Heart Failure, Cardiac Arrhythmia, Pacemakers, and the ICD.   Cardiac Procedures: - Group verbal and written instruction and models to describe the testing methods done to diagnose heart disease. Reviews the outcomes of the test results. Describes the treatment choices: Medical Management, Angioplasty, or Coronary Bypass Surgery.   Cardiac Medications: - Group verbal and written instruction to review commonly prescribed medications for heart disease. Reviews the medication, class of the drug, and side effects. Includes the steps to properly store meds and maintain the prescription regimen.   Go Sex-Intimacy & Heart Disease, Get SMART - Goal Setting: - Group verbal and written instruction through game format to discuss heart disease and the return to sexual intimacy. Provides group verbal and written material to discuss and apply goal setting through the application of the S.M.A.R.T. Method.   Other Matters of the Heart: - Provides group verbal, written materials and models to describe Heart Failure, Angina, Valve Disease, and Diabetes in the realm of heart disease. Includes description of the disease process and treatment options available to the cardiac patient.   Exercise & Equipment Safety: - Individual verbal instruction and demonstration of equipment use and safety with use of the equipment.          [facility]   Date  03/16/15   Educator  Sb   Instruction Review Code  2- meets goals/outcomes      Infection Prevention: - Provides verbal and written material to individual with discussion of infection control including proper hand washing and proper equipment cleaning during exercise session.      [facility]   Date  03/16/15    Educator  SB   Instruction Review Code  2- meets goals/outcomes      Falls Prevention: - Provides verbal and written material to individual with discussion of falls prevention and safety.      [facility]  Date  03/16/15   Educator  sb   Instruction Review Code  2- meets goals/outcomes      Diabetes: - Individual verbal and written instruction to review signs/symptoms of diabetes, desired ranges of glucose level fasting, after meals and with exercise. Advice that pre and post exercise glucose checks will be done for 3 sessions at entry of program.    Knowledge Questionnaire Score:   Personal Goals and Risk Factors at Admission:     Personal Goals and Risk Factors at Admission - 03/16/15 1353    Personal Goals and Risk Factors on Admission    Weight Management Yes   Intervention Learn and follow the exercise and diet guidelines while in the program. Utilize the nutrition and education classes to help gain knowledge of the diet and exercise expectations in the program  HAs been working on weight loss for diabetes prevention.   Admit Weight 183 lb (83.008 kg)   Goal Weight 185 lb (83.915 kg)   Increase Aerobic Exercise and Physical Activity Yes  Is walking 4 days a week at least 30 min.   Intervention While in program, learn and follow the exercise prescription taught. Start at a low level workload and increase workload after able to maintain previous level for 30 minutes. Increase time before increasing intensity.   Take Less Medication Yes   Intervention Learn your risk factors and begin the lifestyle modifications for risk factor control during your time in the program.   Understand more about Heart/Pulmonary Disease. Yes   Intervention While in program utilize professionals for any questions, and attend the education sessions. Great websites to use are www.americanheart.org or www.lung.org for reliable information.   Diabetes No    Hypertension Yes   Goal Participant will see blood pressure controlled within the values of 140/52mm/Hg or within value directed by their physician.   Intervention Provide nutrition & aerobic exercise along with prescribed medications to achieve BP 140/90 or less.   Lipids Yes   Goal Cholesterol controlled with medications as prescribed, with individualized exercise RX and with personalized nutrition plan. Value goals: LDL < 70mg , HDL > 40mg . Participant states understanding of desired cholesterol values and following prescriptions.   Intervention Provide nutrition & aerobic exercise along with prescribed medications to achieve LDL 70mg , HDL >40mg .   Stress No      Personal Goals and Risk Factors Review:    Personal Goals Discharge (Final Personal Goals and Risk Factors Review):     Comments: Initial orientation and ITP completed. Returns 10/20 for full session.

## 2015-03-19 DIAGNOSIS — Z951 Presence of aortocoronary bypass graft: Secondary | ICD-10-CM | POA: Diagnosis not present

## 2015-03-19 NOTE — Progress Notes (Signed)
Daily Session Note  Patient Details  Name: Tommy [name] MRN: [id number] Date of Birth: [birth year] Referring Provider:  Gustavo Lah, MD  Encounter Date: 03/19/2015  Check In:     Session Check In - 03/19/15 0852    Check-In   Staff Present Lestine Box BS, ACSM EP-C, Exercise Physiologist;Carroll Enterkin RN, BSN;Other   ER physicians immediately available to respond to emergencies See telemetry face sheet for immediately available ER MD   Medication changes reported     No   Fall or balance concerns reported    No   Warm-up and Cool-down Performed on first and last piece of equipment   VAD Patient? No   Pain Assessment   Currently in Pain? No/denies         Goals Met:  Proper associated with RPD/PD & O2 Sat Exercise tolerated well No report of cardiac concerns or symptoms Strength training completed today  Goals Unmet:  Not Applicable  Goals Comments:    Dr. Emily Filbert is Medical Director for [facility].

## 2015-03-20 ENCOUNTER — Telehealth: Payer: Self-pay | Admitting: *Deleted

## 2015-03-20 ENCOUNTER — Encounter: Payer: Self-pay | Admitting: *Deleted

## 2015-03-20 NOTE — Progress Notes (Signed)
Cardiac Individual Treatment Plan  Patient Details  Name: Tommy [name] MRN: [id number] Date of Birth: 1950/11/19 Referring Provider:  Gustavo Lah, MD  Initial Encounter Date:    Visit Diagnosis: S/P CABG x 4  Patient's Home Medications on Admission:  Current outpatient prescriptions:  .  aspirin 81 MG chewable tablet, Chew 81 mg by mouth., Disp: , Rfl:  .  atorvastatin (LIPITOR) 80 MG tablet, Take 80 mg by mouth., Disp: , Rfl:  .  Glucosamine Sulfate 500 MG TABS, Take by mouth., Disp: , Rfl:  .  metoprolol tartrate (LOPRESSOR) 25 MG tablet, Take 25 mg by mouth., Disp: , Rfl:  .  Multiple Vitamin (MULTI-VITAMINS) TABS, Take by mouth., Disp: , Rfl:  .  nicotine (RA NICOTINE) 7 mg/24hr patch, Place onto the skin., Disp: , Rfl:  .  Omega-3 1000 MG CAPS, Take by mouth., Disp: , Rfl:  .  oxyCODONE (OXY IR/ROXICODONE) 5 MG immediate release tablet, Take 5 mg by mouth., Disp: , Rfl:   Past Medical History: No past medical history on file.  Tobacco Use: History  Smoking status  . Former Smoker  . Quit date: 02/23/2015  Smokeless tobacco  . Never Used    Labs: Recent Review Flowsheet Data    Labs for ITP Cardiac and Pulmonary Rehab Latest Ref Rng 08/22/2011 08/23/2011   Cholestrol 0-200 mg/dL - 184   LDLCALC 0-100 mg/dL - 124(H)   HDL 40-60 mg/dL - 38(L)   Trlycerides 0-200 mg/dL - 110   Hemoglobin A1c 4.2-6.3 % 7.1(H) -       Exercise Target Goals:    Exercise Program Goal: Individual exercise prescription set with THRR, safety & activity barriers. Participant demonstrates ability to understand and report RPE using BORG scale, to self-measure pulse accurately, and to acknowledge the importance of the exercise prescription.  Exercise Prescription Goal: Starting with aerobic activity 30 plus minutes a day, 3 days per week for initial exercise prescription. Provide home exercise prescription and guidelines that participant acknowledges understanding prior to  discharge.  Activity Barriers & Risk Stratification:     Activity Barriers & Risk Stratification - 03/16/15 1347    Activity Barriers & Risk Stratification   Activity Barriers Back Problems      6 Minute Walk:     6 Minute Walk      03/16/15 1412       6 Minute Walk   Phase Initial     Distance 1590 feet     Walk Time 6 minutes     Resting HR 83 bpm     Resting BP 126/74 mmHg     Max Ex. HR 102 bpm     Max Ex. BP 138/70 mmHg     RPE 11     Symptoms No        Initial Exercise Prescription:     Initial Exercise Prescription - 03/16/15 1400    Date of Initial Exercise Prescription   Date 03/16/15   Treadmill   MPH 3   Grade 0   Minutes 15   Bike   Level 1.4   Minutes 15   Recumbant Bike   Level 3   Watts 30   Minutes 15   NuStep   Level 4   Watts 50   Minutes 15   Arm Ergometer   Level 1   Watts 10   Minutes 10   Arm/Foot Ergometer   Level 1   Watts 15   Minutes 10  Cybex   Level 3   RPM 30   Minutes 15   Recumbant Elliptical   Level 2   Watts 25   Minutes 15   Elliptical   Level 1   Speed 3   Minutes 5   REL-XR   Level 3   Watts 30   Minutes 15   Prescription Details   Frequency (times per week) 3   Duration Progress to 30 minutes of continuous aerobic without signs/symptoms of physical distress   Intensity   THRR REST +  30   Ratings of Perceived Exertion 11-15   Progression Continue progressive overload as per policy without signs/symptoms or physical distress.   Resistance Training   Training Prescription Yes  ROM only if needed due to muscle soreness   Weight 1   Reps 10-12      Exercise Prescription Changes:   Discharge Exercise Prescription (Final Exercise Prescription Changes):   Nutrition:  Target Goals: Understanding of nutrition guidelines, daily intake of sodium 1500mg , cholesterol 200mg , calories 30% from fat and 7% or less from saturated fats, daily to have 5 or more servings of fruits and  vegetables.  Biometrics:     Pre Biometrics - 03/16/15 1419    Pre Biometrics   Height 5' 10.25" (1.784 m)   Weight 183 lb 1.6 oz (83.054 kg)   Waist Circumference 37 inches   Hip Circumference 40 inches   Waist to Hip Ratio 0.92 %   BMI (Calculated) 26.1       Nutrition Therapy Plan and Nutrition Goals:   Nutrition Discharge: Rate Your Plate Scores:   Nutrition Goals Re-Evaluation:   Psychosocial: Target Goals: Acknowledge presence or absence of depression, maximize coping skills, provide positive support system. Participant is able to verbalize types and ability to use techniques and skills needed for reducing stress and depression.  Initial Review & Psychosocial Screening:     Initial Psych Review & Screening - 03/16/15 1444    Screening Interventions   Interventions Encouraged to exercise      Quality of Life Scores:   PHQ-9:     Recent Review Flowsheet Data    [facility]   Decreased Interest 0   Down, Depressed, Hopeless 1   PHQ - 2 Score 1   Altered sleeping 1   Tired, decreased energy 1   Change in appetite 1   Feeling bad or failure about yourself  1   Trouble concentrating 0   Moving slowly or fidgety/restless 0   Suicidal thoughts 0   PHQ-9 Score 5   Difficult doing work/chores Not difficult at all      Psychosocial Evaluation and Intervention:   Psychosocial Re-Evaluation:   Vocational Rehabilitation: Provide vocational rehab assistance to qualifying candidates.   Vocational Rehab Evaluation & Intervention:     Vocational Rehab - 03/16/15 1350    Initial Vocational Rehab Evaluation & Intervention   Assessment shows need for Vocational Rehabilitation No      Education: Education Goals: Education classes will be provided on a weekly basis, covering required topics. Participant will state understanding/return demonstration of topics presented.  Learning Barriers/Preferences:     Learning  Barriers/Preferences - 03/16/15 1350    Learning Barriers/Preferences   Learning Barriers None   Learning Preferences None      Education Topics: General Nutrition Guidelines/Fats and Fiber: -Group instruction provided by verbal, written material, models and posters to present the general guidelines for heart healthy nutrition. Gives an explanation and  review of dietary fats and fiber.   Controlling Sodium/Reading Food Labels: -Group verbal and written material supporting the discussion of sodium use in heart healthy nutrition. Review and explanation with models, verbal and written materials for utilization of the food label.   Exercise Physiology & Risk Factors: - Group verbal and written instruction with models to review the exercise physiology of the cardiovascular system and associated critical values. Details cardiovascular disease risk factors and the goals associated with each risk factor.   Aerobic Exercise & Resistance Training: - Gives group verbal and written discussion on the health impact of inactivity. On the components of aerobic and resistive training programs and the benefits of this training and how to safely progress through these programs.   Flexibility, Balance, General Exercise Guidelines: - Provides group verbal and written instruction on the benefits of flexibility and balance training programs. Provides general exercise guidelines with specific guidelines to those with heart or lung disease. Demonstration and skill practice provided.   Stress Management: - Provides group verbal and written instruction about the health risks of elevated stress, cause of high stress, and healthy ways to reduce stress.   Depression: - Provides group verbal and written instruction on the correlation between heart/lung disease and depressed mood, treatment options, and the stigmas associated with seeking treatment.   Anatomy & Physiology of the Heart: - Group verbal and  written instruction and models provide basic cardiac anatomy and physiology, with the coronary electrical and arterial systems. Review of: AMI, Angina, Valve disease, Heart Failure, Cardiac Arrhythmia, Pacemakers, and the ICD.   Cardiac Procedures: - Group verbal and written instruction and models to describe the testing methods done to diagnose heart disease. Reviews the outcomes of the test results. Describes the treatment choices: Medical Management, Angioplasty, or Coronary Bypass Surgery.   Cardiac Medications: - Group verbal and written instruction to review commonly prescribed medications for heart disease. Reviews the medication, class of the drug, and side effects. Includes the steps to properly store meds and maintain the prescription regimen.   Go Sex-Intimacy & Heart Disease, Get SMART - Goal Setting: - Group verbal and written instruction through game format to discuss heart disease and the return to sexual intimacy. Provides group verbal and written material to discuss and apply goal setting through the application of the S.M.A.R.T. Method.   Other Matters of the Heart: - Provides group verbal, written materials and models to describe Heart Failure, Angina, Valve Disease, and Diabetes in the realm of heart disease. Includes description of the disease process and treatment options available to the cardiac patient.   Exercise & Equipment Safety: - Individual verbal instruction and demonstration of equipment use and safety with use of the equipment.          Cardiac Rehab from 03/16/2015 in [facility] Cardiac Rehab   Date  03/16/15   Educator  Sb   Instruction Review Code  2- meets goals/outcomes      Infection Prevention: - Provides verbal and written material to individual with discussion of infection control including proper hand washing and proper equipment cleaning during exercise session.      Cardiac Rehab from 03/16/2015 in [facility] Cardiac Rehab   Date  03/16/15   Educator   SB   Instruction Review Code  2- meets goals/outcomes      Falls Prevention: - Provides verbal and written material to individual with discussion of falls prevention and safety.      Cardiac Rehab from 03/16/2015 in [facility] Cardiac Rehab  Date  03/16/15   Educator  sb   Instruction Review Code  2- meets goals/outcomes      Diabetes: - Individual verbal and written instruction to review signs/symptoms of diabetes, desired ranges of glucose level fasting, after meals and with exercise. Advice that pre and post exercise glucose checks will be done for 3 sessions at entry of program.    Knowledge Questionnaire Score:   Personal Goals and Risk Factors at Admission:     Personal Goals and Risk Factors at Admission - 03/16/15 1353    Personal Goals and Risk Factors on Admission    Weight Management Yes   Intervention Learn and follow the exercise and diet guidelines while in the program. Utilize the nutrition and education classes to help gain knowledge of the diet and exercise expectations in the program  HAs been working on weight loss for diabetes prevention.   Admit Weight 183 lb (83.008 kg)   Goal Weight 185 lb (83.915 kg)   Increase Aerobic Exercise and Physical Activity Yes  Is walking 4 days a week at least 30 min.   Intervention While in program, learn and follow the exercise prescription taught. Start at a low level workload and increase workload after able to maintain previous level for 30 minutes. Increase time before increasing intensity.   Take Less Medication Yes   Intervention Learn your risk factors and begin the lifestyle modifications for risk factor control during your time in the program.   Understand more about Heart/Pulmonary Disease. Yes   Intervention While in program utilize professionals for any questions, and attend the education sessions. Great websites to use are www.americanheart.org or www.lung.org for reliable information.   Diabetes No   Hypertension  Yes   Goal Participant will see blood pressure controlled within the values of 140/17mm/Hg or within value directed by their physician.   Intervention Provide nutrition & aerobic exercise along with prescribed medications to achieve BP 140/90 or less.   Lipids Yes   Goal Cholesterol controlled with medications as prescribed, with individualized exercise RX and with personalized nutrition plan. Value goals: LDL < , HDL > . Participant states understanding of desired cholesterol values and following prescriptions.   Intervention Provide nutrition & aerobic exercise along with prescribed medications to achieve LDL 70mg , HDL >40mg .   Stress No      Personal Goals and Risk Factors Review:    Personal Goals Discharge (Final Personal Goals and Risk Factors Review):     Comments:

## 2015-03-24 DIAGNOSIS — Z951 Presence of aortocoronary bypass graft: Secondary | ICD-10-CM | POA: Diagnosis not present

## 2015-03-24 NOTE — Progress Notes (Signed)
Daily Session Note  Patient Details  Name: Casimiro Lienhard MRN: [id number] Date of Birth: 1950/11/18 Referring Provider:  Gustavo Lah, MD  Encounter Date: 03/24/2015  Check In:     Session Check In - 03/24/15 0930    Check-In   Staff Present Other;Diane Joya Gaskins RN, BSN;Renee Dillard Essex MS, ACSM CEP Exercise Physiologist   Medication changes reported     No   Fall or balance concerns reported    No   Warm-up and Cool-down Performed on first and last piece of equipment   VAD Patient? No   Pain Assessment   Currently in Pain? No/denies           Exercise Prescription Changes - 03/24/15 0900    Exercise Review   Progression Yes   Response to Exercise   Comments [name] did well his first of class with no signs or symptoms. I increased him to level 4 in the XR and he was able to complete that with no concerns.    REL-XR   Level 5   Watts 55   Minutes 15      Goals Met:  Independence with exercise equipment Personal goals reviewed No report of cardiac concerns or symptoms Strength training completed today  Goals Unmet:  Not Applicable  Goals Comments:    Dr. Emily Filbert is Medical Director for [place] and [facility].

## 2015-03-26 DIAGNOSIS — Z951 Presence of aortocoronary bypass graft: Secondary | ICD-10-CM

## 2015-03-26 NOTE — Progress Notes (Signed)
Daily Session Note  Patient Details  Name: Lashon Hillier MRN: [id number] Date of Birth: 04/17/1951 Referring Provider:  Ria Bush, MD  Encounter Date: 03/26/2015  Check In:     Session Check In - 03/26/15 0859    Check-In   Staff Present Gerlene Burdock RN, BSN;Steven Way BS, ACSM EP-C, Exercise Physiologist;Other   ER physicians immediately available to respond to emergencies See telemetry face sheet for immediately available ER MD   Medication changes reported     No   Fall or balance concerns reported    No   Warm-up and Cool-down Performed on first and last piece of equipment   VAD Patient? No   Pain Assessment   Currently in Pain? No/denies         Goals Met:  Independence with exercise equipment Exercise tolerated well No report of cardiac concerns or symptoms Strength training completed today  Goals Unmet:  Not Applicable  Goals Comments:    Dr. Emily Filbert is Medical Director for [place] and [facility].

## 2015-03-31 ENCOUNTER — Encounter: Payer: 59 | Attending: Cardiothoracic Surgery

## 2015-03-31 DIAGNOSIS — Z951 Presence of aortocoronary bypass graft: Secondary | ICD-10-CM

## 2015-03-31 NOTE — Progress Notes (Signed)
Daily Session Note  Patient Details  Name: Tommy [name] MRN: [id number] Date of Birth: 1950/11/14 Referring Provider:  Gustavo Lah, MD  Encounter Date: 03/31/2015  Check In:     Session Check In - 03/31/15 0957    Check-In   Staff Present Candiss Norse MS, ACSM CEP Exercise Physiologist;Other;Diane Mariana Arn, BSN   ER physicians immediately available to respond to emergencies See telemetry face sheet for immediately available ER MD   Medication changes reported     No   Fall or balance concerns reported    No   Warm-up and Cool-down Performed on first and last piece of equipment   VAD Patient? No   Pain Assessment   Currently in Pain? No/denies           Exercise Prescription Changes - 03/31/15 0900    Exercise Review   Progression Yes   Response to Exercise   Symptoms no   Comments I spoke with [name] about his increasing his speed on the treadmill and level on the Nustep. He completed those increases today in class without signs or symptoms.    Resistance Training   Training Prescription Yes   Weight 3   Reps 10-15   Interval Training   Interval Training No   Treadmill   MPH 3.2   Grade 0   Minutes 15   NuStep   Level 2   Watts 30   Minutes 15   REL-XR   Level 5   Watts 55   Minutes 15      Goals Met:  Independence with exercise equipment Exercise tolerated well No report of cardiac concerns or symptoms Strength training completed today  Goals Unmet:  Not Applicable  Goals Comments:    Dr. Emily Filbert is Medical Director for [place] and [facility].

## 2015-04-07 DIAGNOSIS — Z951 Presence of aortocoronary bypass graft: Secondary | ICD-10-CM

## 2015-04-07 NOTE — Progress Notes (Signed)
Daily Session Note  Patient Details  Name: Tommy [name] MRN: [id number] Date of Birth: February 20, 1951 Referring Provider:  Gustavo Lah, MD  Encounter Date: 04/07/2015  Check In:     Session Check In - 04/07/15 0919    Check-In   Staff Present Other;Renee Dillard Essex MS, ACSM CEP Exercise Physiologist;Diane Mariana Arn, BSN   ER physicians immediately available to respond to emergencies See telemetry face sheet for immediately available ER MD   Medication changes reported     No   Fall or balance concerns reported    No   Warm-up and Cool-down Performed on first and last piece of equipment   VAD Patient? No   Pain Assessment   Currently in Pain? No/denies         Goals Met:  Independence with exercise equipment Exercise tolerated well No report of cardiac concerns or symptoms Strength training completed today  Goals Unmet:  Not Applicable  Goals Comments:    Dr. Emily Filbert is Medical Director for [place] and [facility].

## 2015-04-08 ENCOUNTER — Encounter: Payer: Self-pay | Admitting: *Deleted

## 2015-04-08 DIAGNOSIS — Z951 Presence of aortocoronary bypass graft: Secondary | ICD-10-CM

## 2015-04-08 NOTE — Progress Notes (Signed)
Cardiac Individual Treatment Plan  Patient Details  Name: Tommy [name] MRN: [id number] Date of Birth: 1950-11-17 Referring Provider:  Gustavo Lah, MD   Initial Encounter Date: 03/16/2015 Visit Diagnosis: S/P CABG x 4  Patient's Home Medications on Admission:  Current outpatient prescriptions:  .  aspirin 81 MG chewable tablet, Chew 81 mg by mouth., Disp: , Rfl:  .  atorvastatin (LIPITOR) 80 MG tablet, Take 80 mg by mouth., Disp: , Rfl:  .  Glucosamine Sulfate 500 MG TABS, Take by mouth., Disp: , Rfl:  .  metoprolol tartrate (LOPRESSOR) 25 MG tablet, Take 25 mg by mouth., Disp: , Rfl:  .  Multiple Vitamin (MULTI-VITAMINS) TABS, Take by mouth., Disp: , Rfl:  .  nicotine (RA NICOTINE) 7 mg/24hr patch, Place onto the skin., Disp: , Rfl:  .  Omega-3 1000 MG CAPS, Take by mouth., Disp: , Rfl:  .  oxyCODONE (OXY IR/ROXICODONE) 5 MG immediate release tablet, Take 5 mg by mouth., Disp: , Rfl:   Past Medical History: No past medical history on file.  Tobacco Use: History  Smoking status  . Former Smoker  . Quit date: 02/23/2015  Smokeless tobacco  . Never Used    Labs: Recent Review Flowsheet Data    Labs for ITP Cardiac and Pulmonary Rehab Latest Ref Rng 08/22/2011 08/23/2011   Cholestrol 0-200 mg/dL - 184   LDLCALC 0-100 mg/dL - 124(H)   HDL 40-60 mg/dL - 38(L)   Trlycerides 0-200 mg/dL - 110   Hemoglobin A1c 4.2-6.3 % 7.1(H) -       Exercise Target Goals:    Exercise Program Goal: Individual exercise prescription set with THRR, safety & activity barriers. Participant demonstrates ability to understand and report RPE using BORG scale, to self-measure pulse accurately, and to acknowledge the importance of the exercise prescription.  Exercise Prescription Goal: Starting with aerobic activity 30 plus minutes a day, 3 days per week for initial exercise prescription. Provide home exercise prescription and guidelines that participant acknowledges understanding  prior to discharge.  Activity Barriers & Risk Stratification:     Activity Barriers & Risk Stratification - 03/16/15 1347    Activity Barriers & Risk Stratification   Activity Barriers Back Problems      6 Minute Walk:     6 Minute Walk      03/16/15 1412       6 Minute Walk   Phase Initial     Distance 1590 feet     Walk Time 6 minutes     Resting HR 83 bpm     Resting BP 126/74 mmHg     Max Ex. HR 102 bpm     Max Ex. BP 138/70 mmHg     RPE 11     Symptoms No        Initial Exercise Prescription:     Initial Exercise Prescription - 03/16/15 1400    Date of Initial Exercise Prescription   Date 03/16/15   Treadmill   MPH 3   Grade 0   Minutes 15   Bike   Level 1.4   Minutes 15   Recumbant Bike   Level 3   Watts 30   Minutes 15   NuStep   Level 4   Watts 50   Minutes 15   Arm Ergometer   Level 1   Watts 10   Minutes 10   Arm/Foot Ergometer   Level 1   Watts 15   Minutes 10   Cybex  Level 3   RPM 30   Minutes 15   Recumbant Elliptical   Level 2   Watts 25   Minutes 15   Elliptical   Level 1   Speed 3   Minutes 5   REL-XR   Level 3   Watts 30   Minutes 15   Prescription Details   Frequency (times per week) 3   Duration Progress to 30 minutes of continuous aerobic without signs/symptoms of physical distress   Intensity   THRR REST +  30   Ratings of Perceived Exertion 11-15   Progression Continue progressive overload as per policy without signs/symptoms or physical distress.   Resistance Training   Training Prescription Yes  ROM only if needed due to muscle soreness   Weight 1   Reps 10-12      Exercise Prescription Changes:     Exercise Prescription Changes      03/24/15 0900 03/31/15 0900 04/07/15 1400       Exercise Review   Progression Yes Yes No     Response to Exercise   Blood Pressure (Admit)   118/76 mmHg     Blood Pressure (Exercise)   132/72 mmHg     Blood Pressure (Exit)   102/64 mmHg     Heart Rate (Admit)    109 bpm     Heart Rate (Exercise)   123 bpm     Heart Rate (Exit)   87 bpm     Rating of Perceived Exertion (Exercise)   12     Symptoms  no no     Comments [name] did well his first of class with no signs or symptoms. I increased him to level 4 in the XR and he was able to complete that with no concerns.  I spoke with [name] about his increasing his speed on the treadmill and level on the Nustep. He completed those increases today in class without signs or symptoms.       Duration   Progress to 30 minutes of continuous aerobic without signs/symptoms of physical distress     Intensity   Rest + 30     Progression   Continue progressive overload as per policy without signs/symptoms or physical distress.     Resistance Training   Training Prescription  Yes Yes     Weight  3 3     Reps  10-15 10-15     Interval Training   Interval Training  No No     Treadmill   MPH  3.2 3.2     Grade  0 0     Minutes  15 15     NuStep   Level  2 2     Watts  30 30     Minutes  15 15     REL-XR   Level _0 Watts 55 55 55     Minutes _1 Discharge Exercise Prescription (Final Exercise Prescription Changes):     Exercise Prescription Changes - 04/07/15 1400    Exercise Review   Progression No   Response to Exercise   Blood Pressure (Admit) 118/76 mmHg   Blood Pressure (Exercise) 132/72 mmHg   Blood Pressure (Exit) 102/64 mmHg   Heart Rate (Admit) 109 bpm   Heart Rate (Exercise) 123 bpm   Heart Rate (Exit) 87 bpm   Rating of Perceived Exertion (Exercise) 12   Symptoms no  Duration Progress to 30 minutes of continuous aerobic without signs/symptoms of physical distress   Intensity Rest + 30   Progression Continue progressive overload as per policy without signs/symptoms or physical distress.   Resistance Training   Training Prescription Yes   Weight 3   Reps 10-15   Interval Training   Interval Training No   Treadmill   MPH 3.2   Grade 0   Minutes 15   NuStep    Level 2   Watts 30   Minutes 15   REL-XR   Level 5   Watts 55   Minutes 15      Nutrition:  Target Goals: Understanding of nutrition guidelines, daily intake of sodium <1538m, cholesterol <2026m calories 30% from fat and 7% or less from saturated fats, daily to have 5 or more servings of fruits and vegetables.  Biometrics:     Pre Biometrics - 03/16/15 1419    Pre Biometrics   Height 5' 10.25" (1.784 m)   Weight 183 lb 1.6 oz (83.054 kg)   Waist Circumference 37 inches   Hip Circumference 40 inches   Waist to Hip Ratio 0.92 %   BMI (Calculated) 26.1       Nutrition Therapy Plan and Nutrition Goals:   Nutrition Discharge: Rate Your Plate Scores:   Nutrition Goals Re-Evaluation:     Nutrition Goals Re-Evaluation      04/08/15 0923           Personal Goal #1 Re-Evaluation   Personal Goal #1 [name] eats healthy. Has a good BMI also.        Goal Progress Seen Yes          Psychosocial: Target Goals: Acknowledge presence or absence of depression, maximize coping skills, provide positive support system. Participant is able to verbalize types and ability to use techniques and skills needed for reducing stress and depression.  Initial Review & Psychosocial Screening:     Initial Psych Review & Screening - 03/16/15 1444    Screening Interventions   Interventions Encouraged to exercise      Quality of Life Scores:   PHQ-9:     Recent Review Flowsheet Data    [facility] 03/16/2015   Decreased Interest 0   Down, Depressed, Hopeless 1   PHQ - 2 Score 1   Altered sleeping 1   Tired, decreased energy 1   Change in appetite 1   Feeling bad or failure about yourself  1   Trouble concentrating 0   Moving slowly or fidgety/restless 0   Suicidal thoughts 0   PHQ-9 Score 5   Difficult doing work/chores Not difficult at all      Psychosocial Evaluation and Intervention:     Psychosocial Evaluation - 03/31/15 0954    Psychosocial  Evaluation & Interventions   Interventions Stress management education;Relaxation education;Encouraged to exercise with the program and follow exercise prescription   Comments Counselor met with Mr. WiGrieseroday.  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  He has a strong support system with a spouse of [id number] and an adult son and his significant other who live in the home with them.  He reports sleeping better lately and his appetite is good.  He also states he is typically positive, although he has had some post surgery depressive symtpoms that are making it difficult for him to quit smoking as a coping mechanism.  Counselor recommended he speak with his  doctor about possible medication for these depressive symptoms to help treat this as well as decrease the need to smoke.  Mr. 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     Continued Psychosocial Services Needed Yes  Mr. Quebedeaux will benefit from talking with his doctor about medication for mood symptoms  as well as participating in the psychoeducational components of this program.        Psychosocial Re-Evaluation:     Psychosocial Re-Evaluation      04/08/15 0924           Psychosocial Re-Evaluation   Interventions Encouraged to attend Cardiac Rehabilitation for the exercise          Vocational Rehabilitation: Provide vocational rehab assistance to qualifying candidates.   Vocational Rehab Evaluation & Intervention:     Vocational Rehab - 03/16/15 1350    Initial Vocational Rehab Evaluation & Intervention   Assessment shows need for Vocational Rehabilitation No      Education: Education Goals: Education classes will be provided on a weekly basis, covering required topics. Participant will state understanding/return demonstration of topics presented.  Learning Barriers/Preferences:     Learning  Barriers/Preferences - 03/16/15 1350    Learning Barriers/Preferences   Learning Barriers None   Learning Preferences None      Education Topics: General Nutrition Guidelines/Fats and Fiber: -Group instruction provided by verbal, written material, models and posters to present the general guidelines for heart healthy nutrition. Gives an explanation and review of dietary fats and fiber.   Controlling Sodium/Reading Food Labels: -Group verbal and written material supporting the discussion of sodium use in heart healthy nutrition. Review and explanation with models, verbal and written materials for utilization of the food label.   Exercise Physiology & Risk Factors: - Group verbal and written instruction with models to review the exercise physiology of the cardiovascular system and associated critical values. Details cardiovascular disease risk factors and the goals associated with each risk factor.   Aerobic Exercise & Resistance Training: - Gives group verbal and written discussion on the health impact of inactivity. On the components of aerobic and resistive training programs and the benefits of this training and how to safely progress through these programs.          Cardiac Rehab from 04/07/2015 in [facility] Card[facility] Re[facility]   Date  04/07/15   Educator  RM   Instruction Review Code  2- meets goals/outcomes      Flexibility, Balance, General Exercise Guidelines: - Provides group verbal and written instruction on the benefits of flexibility and balance training programs. Provides general exercise guidelines with specific guidelines to those with heart or lung disease. Demonstration and skill practice provided.   Stress Management: - Provides group verbal and written instruction about the health risks of elevated stress, cause of high stress, and healthy ways to reduce stress.   Depression: - Provides group verbal and written instruction on the correlation between heart/lung disease and  depressed mood, treatment options, and the stigmas associated with seeking treatment.      Cardiac Rehab from 04/07/2015 in [facility] Cardiac Rehab   Date  03/26/15   Educator  C. Franchot Pollitt. RN   Instruction Review Code  2- meets goals/outcomes      Anatomy & Physiology of the Heart: - Group verbal and written instruction and models provide basic cardiac anatomy and physiology, with the coronary electrical and  arterial systems. Review of: AMI, Angina, Valve disease, Heart Failure, Cardiac Arrhythmia, Pacemakers, and the ICD.   Cardiac Procedures: - Group verbal and written instruction and models to describe the testing methods done to diagnose heart disease. Reviews the outcomes of the test results. Describes the treatment choices: Medical Management, Angioplasty, or Coronary Bypass Surgery.      Cardiac Rehab from 04/07/2015 in [facility] Cardiac Rehab   Date  03/24/15   Educator  DW   Instruction Review Code  2- meets goals/outcomes      Cardiac Medications: - Group verbal and written instruction to review commonly prescribed medications for heart disease. Reviews the medication, class of the drug, and side effects. Includes the steps to properly store meds and maintain the prescription regimen.   Go Sex-Intimacy & Heart Disease, Get SMART - Goal Setting: - Group verbal and written instruction through game format to discuss heart disease and the return to sexual intimacy. Provides group verbal and written material to discuss and apply goal setting through the application of the S.M.A.R.T. Method.      Cardiac Rehab from 04/07/2015 in [facility] Cardiac Re[facility]   Date  03/24/15   Educator  DW   Instruction Review Code  2- meets goals/outcomes      Other Matters of the Heart: - Provides group verbal, written materials and models to describe Heart Failure, Angina, Valve Disease, and Diabetes in the realm of heart disease. Includes description of the disease process and treatment options available to the  cardiac patient.   Exercise & Equipment Safety: - Individual verbal instruction and demonstration of equipment use and safety with use of the equipment.      Cardiac Rehab from 04/07/2015 in [facility] Cardiac Rehab   Date  03/16/15   Educator  Sb   Instruction Review Code  2- meets goals/outcomes      Infection Prevention: - Provides verbal and written material to individual with discussion of infection control including proper hand washing and proper equipment cleaning during exercise session.      Cardiac Rehab from 04/07/2015 in [facility] Cardiac Rehab   Date  03/16/15   Educator  SB   Instruction Review Code  2- meets goals/outcomes      Falls Prevention: - Provides verbal and written material to individual with discussion of falls prevention and safety.      Cardiac Rehab from 04/07/2015 in [facility] Cardiac Rehab   Date  03/16/15   Educator  sb   Instruction Review Code  2- meets goals/outcomes      Diabetes: - Individual verbal and written instruction to review signs/symptoms of diabetes, desired ranges of glucose level fasting, after meals and with exercise. Advice that pre and post exercise glucose checks will be done for 3 sessions at entry of program.    Knowledge Questionnaire Score:   Personal Goals and Risk Factors at Admission:     Personal Goals and Risk Factors at Admission - 03/16/15 1353    Personal Goals and Risk Factors on Admission    Weight Management Yes   Intervention Learn and follow the exercise and diet guidelines while in the program. Utilize the nutrition and education classes to help gain knowledge of the diet and exercise expectations in the program  HAs been working on weight loss for diabetes prevention.   Admit Weight 183 lb (83.008 kg)   Goal Weight 185 lb (83.915 kg)   Increase Aerobic Exercise and Physical Activity Yes  Is walking 4 days a week at  least 30 min.   Intervention While in program, learn and follow the exercise prescription taught.  Start at a low level workload and increase workload after able to maintain previous level for 30 minutes. Increase time before increasing intensity.   Take Less Medication Yes   Intervention Learn your risk factors and begin the lifestyle modifications for risk factor control during your time in the program.   Understand more about Heart/Pulmonary Disease. Yes   Intervention While in program utilize professionals for any questions, and attend the education sessions. Great websites to use are www.americanheart.org or www.lung.org for reliable information.   Diabetes No   Hypertension Yes   Goal Participant will see blood pressure controlled within the values of 140/69m/Hg or within value directed by their physician.   Intervention Provide nutrition & aerobic exercise along with prescribed medications to achieve BP 140/90 or less.   Lipids Yes   Goal Cholesterol controlled with medications as prescribed, with individualized exercise RX and with personalized nutrition plan. Value goals: LDL < 721m HDL > 4061mParticipant states understanding of desired cholesterol values and following prescriptions.   Intervention Provide nutrition & aerobic exercise along with prescribed medications to achieve LDL <70m49mDL >40mg10mStress No      Personal Goals and Risk Factors Review:      Goals and Risk Factor Review      04/08/15 0923           Increase Aerobic Exercise and Physical Activity   Goals Progress/Improvement seen  Yes       Comments [name] Marden Nobleincreased his Cardiac Rehab workloads.           Personal Goals Discharge (Final Personal Goals and Risk Factors Review):      Goals and Risk Factor Review - 04/08/15 0923    Increase Aerobic Exercise and Physical Activity   Goals Progress/Improvement seen  Yes   Comments [name] Marden Nobleincreased his Cardiac Rehab workloads.        Comments: 30 day note review.

## 2015-04-14 DIAGNOSIS — Z951 Presence of aortocoronary bypass graft: Secondary | ICD-10-CM | POA: Diagnosis not present

## 2015-04-14 NOTE — Progress Notes (Signed)
Daily Session Note  Patient Details  Name: Jaishon Krisher MRN: [id number] Date of Birth: September 13, 1950 Referring Provider:  Ria Bush, MD  Encounter Date: 04/14/2015  Check In:     Session Check In - 04/14/15 1018    Check-In   Staff Present Diane Joya Gaskins RN, Drusilla Kanner MS, ACSM CEP Exercise Physiologist;Other   ER physicians immediately available to respond to emergencies See telemetry face sheet for immediately available ER MD   Medication changes reported     No   Fall or balance concerns reported    No   Warm-up and Cool-down Performed on first and last piece of equipment   VAD Patient? No   Pain Assessment   Currently in Pain? No/denies   Multiple Pain Sites No         Goals Met:  Independence with exercise equipment Exercise tolerated well No report of cardiac concerns or symptoms Strength training completed today  Goals Unmet:  Not Applicable  Goals Comments: Patient completed exercise prescription and all exercise goals during rehab session. The exercise was tolerated well and the patient is progressing in the program.    Dr. Emily Filbert is Medical Director for [facility] [facility].

## 2015-04-16 DIAGNOSIS — Z951 Presence of aortocoronary bypass graft: Secondary | ICD-10-CM

## 2015-04-16 NOTE — Progress Notes (Signed)
Daily Session Note  Patient Details  Name: Tommy [name] MRN: [id number] Date of Birth: [birth year] Referring Provider:  Gustavo Lah, MD  Encounter Date: 04/16/2015  Check In:     Session Check In - 04/16/15 0859    Check-In   Staff Present Other;Carroll Enterkin RN, BSN;Steven Way BS, ACSM EP-C, Exercise Physiologist   ER physicians immediately available to respond to emergencies See telemetry face sheet for immediately available ER MD   Medication changes reported     No   Fall or balance concerns reported    No   Warm-up and Cool-down Performed on first and last piece of equipment   VAD Patient? No   Pain Assessment   Currently in Pain? No/denies         Goals Met:  Independence with exercise equipment Exercise tolerated well No report of cardiac concerns or symptoms Strength training completed today  Goals Unmet:  Not Applicable  Goals Comments:    Dr. Emily Filbert is Medical Director for [place] [facility].

## 2015-04-28 DIAGNOSIS — Z951 Presence of aortocoronary bypass graft: Secondary | ICD-10-CM

## 2015-04-28 NOTE — Progress Notes (Signed)
Daily Session Note  Patient Details  Name: Tommy [name] MRN: [id number] Date of Birth: 12/16/1950 Referring Provider:  Gustavo Lah, MD  Encounter Date: 04/28/2015  Check In:     Session Check In - 04/28/15 0956    Check-In   Staff Present Candiss Norse, MS, ACSM CEP, Exercise Physiologist;Diane Joya Gaskins, RN, BSN;Junice Fei, BS, ACSM EP-C, Exercise Physiologist   ER physicians immediately available to respond to emergencies See telemetry face sheet for immediately available ER MD   Medication changes reported     No   Fall or balance concerns reported    No   Warm-up and Cool-down Performed on first and last piece of equipment   VAD Patient? No   Pain Assessment   Currently in Pain? No/denies         Goals Met:  Proper associated with RPD/PD & O2 Sat Exercise tolerated well No report of cardiac concerns or symptoms Strength training completed today  Goals Unmet:  Not Applicable  Goals Comments:    Dr. Emily Filbert is Medical Director for [place] [facility].

## 2015-04-30 ENCOUNTER — Encounter: Payer: 59 | Attending: Cardiothoracic Surgery

## 2015-04-30 DIAGNOSIS — Z951 Presence of aortocoronary bypass graft: Secondary | ICD-10-CM | POA: Insufficient documentation

## 2015-05-05 ENCOUNTER — Encounter: Payer: 59 | Admitting: *Deleted

## 2015-05-05 DIAGNOSIS — Z951 Presence of aortocoronary bypass graft: Secondary | ICD-10-CM | POA: Diagnosis not present

## 2015-05-05 NOTE — Progress Notes (Signed)
Cardiac Individual Treatment Plan  Patient Details  Name: Tommy [name] MRN: [id number] Date of Birth: 1950/11/19 Referring Provider:  Gustavo Lah, MD  Initial Encounter Date:    Visit Diagnosis: S/P CABG x 4  Patient's Home Medications on Admission:  Current outpatient prescriptions:  .  aspirin 81 MG chewable tablet, Chew 81 mg by mouth., Disp: , Rfl:  .  atorvastatin (LIPITOR) 80 MG tablet, Take 80 mg by mouth., Disp: , Rfl:  .  Glucosamine Sulfate 500 MG TABS, Take by mouth., Disp: , Rfl:  .  metoprolol tartrate (LOPRESSOR) 25 MG tablet, Take 25 mg by mouth., Disp: , Rfl:  .  Multiple Vitamin (MULTI-VITAMINS) TABS, Take by mouth., Disp: , Rfl:  .  nicotine (RA NICOTINE) 7 mg/24hr patch, Place onto the skin., Disp: , Rfl:  .  Omega-3 1000 MG CAPS, Take by mouth., Disp: , Rfl:  .  oxyCODONE (OXY IR/ROXICODONE) 5 MG immediate release tablet, Take 5 mg by mouth., Disp: , Rfl:   Past Medical History: No past medical history on file.  Tobacco Use: History  Smoking status  . Former Smoker  . Quit date: 02/23/2015  Smokeless tobacco  . Never Used    Labs: Recent Review Flowsheet Data    Labs for ITP Cardiac and Pulmonary Rehab Latest Ref Rng 08/22/2011 08/23/2011   Cholestrol 0-200 mg/dL - 184   LDLCALC 0-100 mg/dL - 124(H)   HDL 40-60 mg/dL - 38(L)   Trlycerides 0-200 mg/dL - 110   Hemoglobin A1c 4.2-6.3 % 7.1(H) -       Exercise Target Goals:    Exercise Program Goal: Individual exercise prescription set with THRR, safety & activity barriers. Participant demonstrates ability to understand and report RPE using BORG scale, to self-measure pulse accurately, and to acknowledge the importance of the exercise prescription.  Exercise Prescription Goal: Starting with aerobic activity 30 plus minutes a day, 3 days per week for initial exercise prescription. Provide home exercise prescription and guidelines that participant acknowledges understanding prior to  discharge.  Activity Barriers & Risk Stratification:     Activity Barriers & Risk Stratification - 03/16/15 1347    Activity Barriers & Risk Stratification   Activity Barriers Back Problems      6 Minute Walk:     6 Minute Walk      03/16/15 1412       6 Minute Walk   Phase Initial     Distance 1590 feet     Walk Time 6 minutes     Resting HR 83 bpm     Resting BP 126/74 mmHg     Max Ex. HR 102 bpm     Max Ex. BP 138/70 mmHg     RPE 11     Symptoms No        Initial Exercise Prescription:     Initial Exercise Prescription - 03/16/15 1400    Date of Initial Exercise Prescription   Date 03/16/15   Treadmill   MPH 3   Grade 0   Minutes 15   Bike   Level 1.4   Minutes 15   Recumbant Bike   Level 3   Watts 30   Minutes 15   NuStep   Level 4   Watts 50   Minutes 15   Arm Ergometer   Level 1   Watts 10   Minutes 10   Arm/Foot Ergometer   Level 1   Watts 15   Minutes 10  Cybex   Level 3   RPM 30   Minutes 15   Recumbant Elliptical   Level 2   Watts 25   Minutes 15   Elliptical   Level 1   Speed 3   Minutes 5   REL-XR   Level 3   Watts 30   Minutes 15   Prescription Details   Frequency (times per week) 3   Duration Progress to 30 minutes of continuous aerobic without signs/symptoms of physical distress   Intensity   THRR REST +  30   Ratings of Perceived Exertion 11-15   Progression Continue progressive overload as per policy without signs/symptoms or physical distress.   Resistance Training   Training Prescription Yes  ROM only if needed due to muscle soreness   Weight 1   Reps 10-12      Exercise Prescription Changes:     Exercise Prescription Changes      03/24/15 0900 03/31/15 0900 04/07/15 1400 04/14/15 1634 04/28/15 1000   Exercise Review   Progression Yes Yes No No No   Response to Exercise   Blood Pressure (Admit)   118/76 mmHg 118/80 mmHg    Blood Pressure (Exercise)   132/72 mmHg 140/72 mmHg    Blood Pressure  (Exit)   102/64 mmHg 98/60 mmHg    Heart Rate (Admit)   109 bpm 89 bpm    Heart Rate (Exercise)   123 bpm 128 bpm    Heart Rate (Exit)   87 bpm 96 bpm    Rating of Perceived Exertion (Exercise)   12 12    Symptoms  no no no None   Comments Tommy [name] did well his first of class with no signs or symptoms. I increased him to level 4 in the XR and he was able to complete that with no concerns.  I spoke with Tommy [name] about his increasing his speed on the treadmill and level on the Nustep. He completed those increases today in class without signs or symptoms.    Reviewed individualized exercise prescription and made increases per departmental policy. Exercise increases were discussed with the patient and they were able to perform the new work loads without issue (no signs or symptoms).    Duration   Progress to 30 minutes of continuous aerobic without signs/symptoms of physical distress Progress to 30 minutes of continuous aerobic without signs/symptoms of physical distress Progress to 30 minutes of continuous aerobic without signs/symptoms of physical distress   Intensity   Rest + 30 Rest + 30 Rest + 30   Progression   Continue progressive overload as per policy without signs/symptoms or physical distress. Continue progressive overload as per policy without signs/symptoms or physical distress. Continue progressive overload as per policy without signs/symptoms or physical distress.   Resistance Training   Training Prescription  Yes Yes Yes Yes   Weight  3 3 3 3    Reps  10-15 10-15 10-15 10-15   Interval Training   Interval Training  No No No No   Treadmill   MPH  3.2 3.2 3.2 3.2   Grade  0 0 0 1   Minutes  15 15 15 20    NuStep   Level  2 2 2 3    Watts  30 30 30  40   Minutes  15 15 15 20    REL-XR   Level 5 5 5 5 5    Watts 55 55 55 55 55   Minutes 15 15 15 15  15  05/05/15 0900           Exercise Review   Progression No       Response to Exercise   Symptoms None       Comments Tommy [name] will be  traveling for the next week. I reviewed with him things he can do while he is traveling in order to maintain his exercise progress. He plans on doing a lot of walking and staying active while he is away.       Duration Progress to 30 minutes of continuous aerobic without signs/symptoms of physical distress       Intensity Rest + 30       Progression Continue progressive overload as per policy without signs/symptoms or physical distress.       Resistance Training   Training Prescription Yes       Weight 3       Reps 10-15       Interval Training   Interval Training No       Treadmill   MPH 3.2       Grade 1       Minutes 20       NuStep   Level 3       Watts 40       Minutes 20       REL-XR   Level 5       Watts 55       Minutes 15          Discharge Exercise Prescription (Final Exercise Prescription Changes):     Exercise Prescription Changes - 05/05/15 0900    Exercise Review   Progression No   Response to Exercise   Symptoms None   Comments Tommy [name] will be traveling for the next week. I reviewed with him things he can do while he is traveling in order to maintain his exercise progress. He plans on doing a lot of walking and staying active while he is away.   Duration Progress to 30 minutes of continuous aerobic without signs/symptoms of physical distress   Intensity Rest + 30   Progression Continue progressive overload as per policy without signs/symptoms or physical distress.   Resistance Training   Training Prescription Yes   Weight 3   Reps 10-15   Interval Training   Interval Training No   Treadmill   MPH 3.2   Grade 1   Minutes 20   NuStep   Level 3   Watts 40   Minutes 20   REL-XR   Level 5   Watts 55   Minutes 15      Nutrition:  Target Goals: Understanding of nutrition guidelines, daily intake of sodium <1573m, cholesterol <2074m calories 30% from fat and 7% or less from saturated fats, daily to have 5 or more servings of fruits and  vegetables.  Biometrics:     Pre Biometrics - 03/16/15 1419    Pre Biometrics   Height 5' 10.25" (1.784 m)   Weight 183 lb 1.6 oz (83.054 kg)   Waist Circumference 37 inches   Hip Circumference 40 inches   Waist to Hip Ratio 0.92 %   BMI (Calculated) 26.1       Nutrition Therapy Plan and Nutrition Goals:   Nutrition Discharge: Rate Your Plate Scores:   Nutrition Goals Re-Evaluation:     Nutrition Goals Re-Evaluation      04/08/15 0923           Personal Goal #1 Re-Evaluation  Personal Goal #1 Tommy [name] eats healthy. Has a good BMI also.        Goal Progress Seen Yes          Psychosocial: Target Goals: Acknowledge presence or absence of depression, maximize coping skills, provide positive support system. Participant is able to verbalize types and ability to use techniques and skills needed for reducing stress and depression.  Initial Review & Psychosocial Screening:     Initial Psych Review & Screening - 03/16/15 1444    Screening Interventions   Interventions Encouraged to exercise      Quality of Life Scores:   PHQ-9:     Recent Review Flowsheet Data    Depression screen [facility] 2/9 03/16/2015   Decreased Interest 0   Down, Depressed, Hopeless 1   PHQ - 2 Score 1   Altered sleeping 1   Tired, decreased energy 1   Change in appetite 1   Feeling bad or failure about yourself  1   Trouble concentrating 0   Moving slowly or fidgety/restless 0   Suicidal thoughts 0   PHQ-9 Score 5   Difficult doing work/chores Not difficult at all      Psychosocial Evaluation and Intervention:     Psychosocial Evaluation - 03/31/15 0954    Psychosocial Evaluation & Interventions   Interventions Stress management education;Relaxation education;Encouraged to exercise with the program and follow exercise prescription   Comments Counselor met with Mr. Langston today.  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  He has a strong support  system with a spouse of 75 years and an adult son and his significant other who live in the home with them.  He reports sleeping better lately and his appetite is good.  He also states he is typically positive, although he has had some post surgery depressive symtpoms that are making it difficult for him to quit smoking as a coping mechanism.  Counselor recommended he speak with his doctor about possible medication for these depressive symptoms to help treat this as well as decrease the need to smoke.  Mr. 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     Continued Psychosocial Services Needed Yes  Mr. Rehman will benefit from talking with his doctor about medication for mood symptoms  as well as participating in the psychoeducational components of this program.        Psychosocial Re-Evaluation:     Psychosocial Re-Evaluation      04/08/15 0924           Psychosocial Re-Evaluation   Interventions Encouraged to attend Cardiac Rehabilitation for the exercise          Vocational Rehabilitation: Provide vocational rehab assistance to qualifying candidates.   Vocational Rehab Evaluation & Intervention:     Vocational Rehab - 03/16/15 1350    Initial Vocational Rehab Evaluation & Intervention   Assessment shows need for Vocational Rehabilitation No      Education: Education Goals: Education classes will be provided on a weekly basis, covering required topics. Participant will state understanding/return demonstration of topics presented.  Learning Barriers/Preferences:     Learning Barriers/Preferences - 03/16/15 1350    Learning Barriers/Preferences   Learning Barriers None   Learning Preferences None      Education Topics: General Nutrition Guidelines/Fats and Fiber: -Group instruction provided by verbal, written material, models and posters to  present the general guidelines for heart  healthy nutrition. Gives an explanation and review of dietary fats and fiber.          Cardiac Rehab from 05/05/2015 in [facility] Cardiac Rehab   Date  05/05/15   Educator  PI   Instruction Review Code  2- meets goals/outcomes      Controlling Sodium/Reading Food Labels: -Group verbal and written material supporting the discussion of sodium use in heart healthy nutrition. Review and explanation with models, verbal and written materials for utilization of the food label.   Exercise Physiology & Risk Factors: - Group verbal and written instruction with models to review the exercise physiology of the cardiovascular system and associated critical values. Details cardiovascular disease risk factors and the goals associated with each risk factor.   Aerobic Exercise & Resistance Training: - Gives group verbal and written discussion on the health impact of inactivity. On the components of aerobic and resistive training programs and the benefits of this training and how to safely progress through these programs.      Cardiac Rehab from 05/05/2015 in [facility] Cardiac Rehab   Date  04/07/15   Educator  RM   Instruction Review Code  2- meets goals/outcomes      Flexibility, Balance, General Exercise Guidelines: - Provides group verbal and written instruction on the benefits of flexibility and balance training programs. Provides general exercise guidelines with specific guidelines to those with heart or lung disease. Demonstration and skill practice provided.      Cardiac Rehab from 05/05/2015 in [facility] Cardiac Rehab   Date  04/14/15   Educator  RM   Instruction Review Code  2- meets goals/outcomes      Stress Management: - Provides group verbal and written instruction about the health risks of elevated stress, cause of high stress, and healthy ways to reduce stress.      Cardiac Rehab from 05/05/2015 in [facility] Cardiac Rehab   Date  04/16/15   Educator  C.Enterkin, RN   Instruction Review Code  2- meets  goals/outcomes      Depression: - Provides group verbal and written instruction on the correlation between heart/lung disease and depressed mood, treatment options, and the stigmas associated with seeking treatment.      Cardiac Rehab from 05/05/2015 in [facility] Cardiac Rehab   Date  03/26/15   Educator  C. Enterkin. RN   Instruction Review Code  2- meets goals/outcomes      Anatomy & Physiology of the Heart: - Group verbal and written instruction and models provide basic cardiac anatomy and physiology, with the coronary electrical and arterial systems. Review of: AMI, Angina, Valve disease, Heart Failure, Cardiac Arrhythmia, Pacemakers, and the ICD.   Cardiac Procedures: - Group verbal and written instruction and models to describe the testing methods done to diagnose heart disease. Reviews the outcomes of the test results. Describes the treatment choices: Medical Management, Angioplasty, or Coronary Bypass Surgery.      Cardiac Rehab from 05/05/2015 in [facility] Cardiac Rehab   Date  03/24/15   Educator  DW   Instruction Review Code  2- meets goals/outcomes      Cardiac Medications: - Group verbal and written instruction to review commonly prescribed medications for heart disease. Reviews the medication, class of the drug, and side effects. Includes the steps to properly store meds and maintain the prescription regimen.   Go Sex-Intimacy & Heart Disease, Get SMART - Goal Setting: - Group verbal and written instruction through game format to  discuss heart disease and the return to sexual intimacy. Provides group verbal and written material to discuss and apply goal setting through the application of the S.M.A.R.T. Method.      Cardiac Rehab from 05/05/2015 in [facility] Cardiac Rehab   Date  03/24/15   Educator  DW   Instruction Review Code  2- meets goals/outcomes      Other Matters of the Heart: - Provides group verbal, written materials and models to describe Heart Failure, Angina, Valve  Disease, and Diabetes in the realm of heart disease. Includes description of the disease process and treatment options available to the cardiac patient.   Exercise & Equipment Safety: - Individual verbal instruction and demonstration of equipment use and safety with use of the equipment.      Cardiac Rehab from 05/05/2015 in [facility] Cardiac Rehab   Date  03/16/15   Educator  Sb   Instruction Review Code  2- meets goals/outcomes      Infection Prevention: - Provides verbal and written material to individual with discussion of infection control including proper hand washing and proper equipment cleaning during exercise session.      Cardiac Rehab from 05/05/2015 in [facility] Cardiac Rehab   Date  03/16/15   Educator  SB   Instruction Review Code  2- meets goals/outcomes      Falls Prevention: - Provides verbal and written material to individual with discussion of falls prevention and safety.      Cardiac Rehab from 05/05/2015 in [facility] Cardiac Rehab   Date  03/16/15   Educator  sb   Instruction Review Code  2- meets goals/outcomes      Diabetes: - Individual verbal and written instruction to review signs/symptoms of diabetes, desired ranges of glucose level fasting, after meals and with exercise. Advice that pre and post exercise glucose checks will be done for 3 sessions at entry of program.    Knowledge Questionnaire Score:   Personal Goals and Risk Factors at Admission:     Personal Goals and Risk Factors at Admission - 03/16/15 1353    Personal Goals and Risk Factors on Admission    Weight Management Yes   Intervention Learn and follow the exercise and diet guidelines while in the program. Utilize the nutrition and education classes to help gain knowledge of the diet and exercise expectations in the program  HAs been working on weight loss for diabetes prevention.   Admit Weight 183 lb (83.008 kg)   Goal Weight 185 lb (83.915 kg)   Increase Aerobic Exercise and Physical Activity  Yes  Is walking 4 days a week at least 30 min.   Intervention While in program, learn and follow the exercise prescription taught. Start at a low level workload and increase workload after able to maintain previous level for 30 minutes. Increase time before increasing intensity.   Take Less Medication Yes   Intervention Learn your risk factors and begin the lifestyle modifications for risk factor control during your time in the program.   Understand more about Heart/Pulmonary Disease. Yes   Intervention While in program utilize professionals for any questions, and attend the education sessions. Great websites to use are www.americanheart.org or www.lung.org for reliable information.   Diabetes No   Hypertension Yes   Goal Participant will see blood pressure controlled within the values of 140/62m/Hg or within value directed by their physician.   Intervention Provide nutrition & aerobic exercise along with prescribed medications to achieve BP 140/90 or less.   Lipids  Yes   Goal Cholesterol controlled with medications as prescribed, with individualized exercise RX and with personalized nutrition plan. Value goals: LDL < 46m, HDL > 458m Participant states understanding of desired cholesterol values and following prescriptions.   Intervention Provide nutrition & aerobic exercise along with prescribed medications to achieve LDL <7018mHDL >31m75m Stress No      Personal Goals and Risk Factors Review:      Goals and Risk Factor Review      04/08/15 0923 04/28/15 1111         Weight Management   Goals Progress/Improvement seen  Yes      Comments  Weight 184 today.  Patient feels the best when his weight is between 183 - 188.  He tries to keep his weight less than 190, which it is today, as he feels the best when it is less than 190 pounds.        Increase Aerobic Exercise and Physical Activity   Goals Progress/Improvement seen  Yes Yes      Comments DougMarden [name] increased his Cardiac Rehab  workloads.  In addition to coming to Cardiac Rehab on Tuesday and Thursday mornings, Tommy [name] walks every 3/4th mile every morning.  DougMarden Nobleted his anxiety has improved with exercise, as he is finding he does not need his anti-anxiety meds as often when he exercises.  Patient stated he is also sleeping better.        Hypertension   Goal  --  Patient's BP the last 3 sessions has been 98 - 124 798tolic and 60 - 80  diastolic.        Abnormal Lipids   Goal  Cholesterol controlled with medications as prescribed, with individualized exercise RX and with personalized nutrition plan. Value goals: LDL < 70mg6mL > 31mg.5mticipant states understanding of desired cholesterol values and following prescriptions.  Cholesterol medication as prescribed.  No new lab values at this time.  Continue medication for cholesterol as prescribed, diet, and exercise plan.           Personal Goals Discharge (Final Personal Goals and Risk Factors Review):      Goals and Risk Factor Review - 04/28/15 1111    Weight Management   Goals Progress/Improvement seen Yes   Comments Weight 184 today.  Patient feels the best when his weight is between 183 - 188.  He tries to keep his weight less than 190, which it is today, as he feels the best when it is less than 190 pounds.     Increase Aerobic Exercise and Physical Activity   Goals Progress/Improvement seen  Yes   Comments In addition to coming to Cardiac Rehab on Tuesday and Thursday mornings, Tommy [name] walks every 3/4th mile every morning.  Tommy [name] sMarden Nobled his anxiety has improved with exercise, as he is finding he does not need his anti-anxiety meds as often when he exercises.  Patient stated he is also sleeping better.     Hypertension   Goal --  Patient's BP the last 3 sessions has been 98 - 124 Sy921lic and 60 - 80  diastolic.     Abnormal Lipids   Goal Cholesterol controlled with medications as prescribed, with individualized exercise RX and with personalized nutrition plan.  Value goals: LDL < 70mg, 54m> 31mg. P67mcipant states understanding of desired cholesterol values and following prescriptions.  Cholesterol medication as prescribed.  No new lab values at this time.  Continue medication for cholesterol as prescribed, diet, and exercise  plan.        ITP Comments:     ITP Comments      05/05/15 1349           ITP Comments 30 day review preparation  Continue with ITP          Comments:

## 2015-05-05 NOTE — Progress Notes (Signed)
Daily Session Note  Patient Details  Name: Tommy [name] MRN: [id number] Date of Birth: Jul 01, 1950 Referring Provider:  Gustavo Lah, MD  Encounter Date: 05/05/2015  Check In:     Session Check In - 05/05/15 0909    Check-In   Staff Present Candiss Norse, MS, ACSM CEP, Exercise Physiologist;Diane Joya Gaskins, RN, BSN;Mary Kellie Shropshire, RN   ER physicians immediately available to respond to emergencies See telemetry face sheet for immediately available ER MD   Medication changes reported     No   Fall or balance concerns reported    No   Warm-up and Cool-down Performed on first and last piece of equipment   VAD Patient? No   Pain Assessment   Currently in Pain? No/denies   Multiple Pain Sites No           Exercise Prescription Changes - 05/05/15 0900    Exercise Review   Progression No   Response to Exercise   Symptoms None   Comments [name] will be traveling for the next week. I reviewed with him things he can do while he is traveling in order to maintain his exercise progress. He plans on doing a lot of walking and staying active while he is away.   Duration Progress to 30 minutes of continuous aerobic without signs/symptoms of physical distress   Intensity Rest + 30   Progression Continue progressive overload as per policy without signs/symptoms or physical distress.   Resistance Training   Training Prescription Yes   Weight 3   Reps 10-15   Interval Training   Interval Training No   Treadmill   MPH 3.2   Grade 1   Minutes 20   NuStep   Level 3   Watts 40   Minutes 20   REL-XR   Level 5   Watts 55   Minutes 15      Goals Met:  Independence with exercise equipment Exercise tolerated well Personal goals reviewed No report of cardiac concerns or symptoms Strength training completed today  Goals Unmet:  Not Applicable  Goals Comments: Patient completed exercise prescription and all exercise goals during rehab session. The exercise was  tolerated well and the patient is progressing in the program.   Dr. Emily Filbert is Medical Director for [facility].

## 2015-05-06 NOTE — Addendum Note (Signed)
Addended by: Rudy JewBICE, SUSANNE P on: 05/06/2015 10:00 AM   Modules accepted: Orders

## 2015-05-14 DIAGNOSIS — Z951 Presence of aortocoronary bypass graft: Secondary | ICD-10-CM

## 2015-05-14 NOTE — Progress Notes (Signed)
Daily Session Note  Patient Details  Name: Kaidon Kinker MRN: [id number] Date of Birth: 1950/08/28 Referring Provider:  Ria Bush, MD  Encounter Date: 05/14/2015  Check In:     Session Check In - 05/14/15 0900    Check-In   Staff Present Gerlene Burdock, RN, Apolonio Schneiders, BS, Exercise Physiologist;Artem Bunte, BS, ACSM EP-C, Exercise Physiologist   ER physicians immediately available to respond to emergencies See telemetry face sheet for immediately available ER MD   Medication changes reported     No   Fall or balance concerns reported    No   Warm-up and Cool-down Performed on first and last piece of equipment   VAD Patient? No   Pain Assessment   Currently in Pain? No/denies         Goals Met:  Proper associated with RPD/PD & O2 Sat Exercise tolerated well No report of cardiac concerns or symptoms Strength training completed today  Goals Unmet:  Not Applicable  Goals Comments:   Dr. Emily Filbert is Medical Director for [facility] and [facility].

## 2015-05-21 ENCOUNTER — Encounter: Payer: 59 | Admitting: *Deleted

## 2015-05-21 DIAGNOSIS — Z951 Presence of aortocoronary bypass graft: Secondary | ICD-10-CM

## 2015-05-21 NOTE — Progress Notes (Signed)
Daily Session Note  Patient Details  Name: Tommy [name] MRN: [id number] Date of Birth: [birth year] Referring Provider:  Gustavo Lah, MD  Encounter Date: 05/21/2015  Check In:     Session Check In - 05/21/15 1020    Check-In   Staff Present Hessie Knows, BS, Exercise Physiologist;Alera Quevedo Amedeo Plenty, BS, ACSM CEP, Exercise Physiologist;Carroll Enterkin, RN, BSN   ER physicians immediately available to respond to emergencies See telemetry face sheet for immediately available ER MD   Medication changes reported     No   Fall or balance concerns reported    No   Warm-up and Cool-down Performed on first and last piece of equipment   VAD Patient? No   Pain Assessment   Currently in Pain? No/denies   Multiple Pain Sites No           Exercise Prescription Changes - 05/21/15 1000    Exercise Review   Progression Yes   Response to Exercise   Symptoms None   Comments Patient started interval training. Interval training guidelines were discussed with the patient. He tolerated these increases with no signs or symptoms.    Duration Progress to 30 minutes of continuous aerobic without signs/symptoms of physical distress   Intensity Rest + 30   Progression Continue progressive overload as per policy without signs/symptoms or physical distress.   Resistance Training   Training Prescription Yes   Weight 3   Reps 10-15   Interval Training   Interval Training Yes   Equipment Treadmill   Comments 3.2/ 2-4%   Treadmill   MPH 3.2   Grade 4  Interval training from 2-4%   Minutes 20   NuStep   Level 3   Watts 40   Minutes 20   REL-XR   Level 5   Watts 55   Minutes 15   T5 Nustep   Level 4   Watts 40   Minutes 15      Goals Met:  [place]ependence with exercise equipment Exercise tolerated well Personal goals reviewed No report of cardiac concerns or symptoms Strength training completed today  Goals Unmet:  Not Applicable  Goals Comments: Reviewed  individualized exercise prescription and made increases per departmental policy. Exercise increases were discussed with the patient and they were able to perform the new work loads without issue (no signs or symptoms).     Dr. Emily Filbert is Medical Director for [facility].

## 2015-06-02 ENCOUNTER — Encounter: Payer: 59 | Attending: Cardiothoracic Surgery

## 2015-06-02 DIAGNOSIS — Z951 Presence of aortocoronary bypass graft: Secondary | ICD-10-CM | POA: Insufficient documentation

## 2015-06-03 NOTE — Addendum Note (Signed)
Addended by: Rudy JewBICE, Terriah Reggio P on: 06/03/2015 11:20 AM   Modules accepted: Orders

## 2015-06-04 DIAGNOSIS — Z951 Presence of aortocoronary bypass graft: Secondary | ICD-10-CM

## 2015-06-04 NOTE — Progress Notes (Signed)
Daily Session Note  Patient Details  Name: Royce Adair Allard MRN: [id number] Date of Birth: 01/20/1951 Referring Provider:  Gutierrez, Javier, MD  Encounter Date: 06/04/2015  Check In:     Session Check In - 06/04/15 0853    Check-In   Staff Present Kendall McKinney, BS, Exercise Physiologist;Carroll Enterkin, RN, BSN;Steven Way, BS, ACSM EP-C, Exercise Physiologist   ER physicians immediately available to respond to emergencies See telemetry face sheet for immediately available ER MD   Medication changes reported     No   Fall or balance concerns reported    No   VAD Patient? No   Pain Assessment   Currently in Pain? No/denies         Goals Met:  Independence with exercise equipment Exercise tolerated well No report of cardiac concerns or symptoms Strength training completed today  Goals Unmet:  Not Applicable  Goals Comments:    Dr. Mark Miller is Medical Director for [facility] and [facility]. 

## 2015-06-11 DIAGNOSIS — Z951 Presence of aortocoronary bypass graft: Secondary | ICD-10-CM

## 2015-06-11 NOTE — Progress Notes (Signed)
Daily Session Note  Patient Details  Name: Jaron Czarnecki MRN: [id number] Date of Birth: 1951-05-21 Referring Provider:  Ria Bush, MD  Encounter Date: 06/11/2015  Check In:     Session Check In - 06/11/15 0856    Check-In   Staff Present Gerlene Burdock, RN, BSN;Kendall Caprice Beaver, BS, Exercise Physiologist;Caven Perine, BS, ACSM EP-C, Exercise Physiologist   ER physicians immediately available to respond to emergencies See telemetry face sheet for immediately available ER MD   Medication changes reported     No   Fall or balance concerns reported    No   Warm-up and Cool-down Performed on first and last piece of equipment   VAD Patient? No   Pain Assessment   Currently in Pain? No/denies         Goals Met:  Proper associated with RPD/PD & O2 Sat Exercise tolerated well No report of cardiac concerns or symptoms Strength training completed today  Goals Unmet:  Not Applicable  Goals Comments:    Dr. Emily Filbert is Medical Director for [facility].

## 2015-06-17 NOTE — Progress Notes (Signed)
Erroneous encounter

## 2015-06-18 DIAGNOSIS — Z951 Presence of aortocoronary bypass graft: Secondary | ICD-10-CM

## 2015-06-18 NOTE — Progress Notes (Signed)
Daily Session Note  Patient Details  Name: Tommy [name] MRN: [id number] Date of Birth: [birth year] Referring Provider:  Ria Bush, MD  Encounter Date: 06/18/2015  Check In:     Session Check In - 06/18/15 0856    Check-In   Staff Present Gerlene Burdock, RN, BSN;Kendall Caprice Beaver, BS, Exercise Physiologist;Godwin Tedesco, BS, ACSM EP-C, Exercise Physiologist   ER physicians immediately available to respond to emergencies See telemetry face sheet for immediately available ER MD   Medication changes reported     No   Fall or balance concerns reported    No   Warm-up and Cool-down Performed on first and last piece of equipment   VAD Patient? No   Pain Assessment   Currently in Pain? No/denies         Goals Met:  Proper associated with RPD/PD & O2 Sat Exercise tolerated well Personal goals reviewed No report of cardiac concerns or symptoms Strength training completed today  Goals Unmet:  Not Applicable  Goals Comments:    Dr. Emily Filbert is Medical Director for [facility].

## 2015-06-23 ENCOUNTER — Encounter: Payer: 59 | Admitting: *Deleted

## 2015-06-23 DIAGNOSIS — Z951 Presence of aortocoronary bypass graft: Secondary | ICD-10-CM | POA: Diagnosis not present

## 2015-06-23 NOTE — Progress Notes (Signed)
Daily Session Note  Patient Details  Name: Tommy [name] MRN: [id number] Date of Birth: [birth year] Referring Provider:  Gustavo Lah, MD  Encounter Date: 06/23/2015  Check In:     Session Check In - 06/23/15 0936    Check-In   Staff Present Candiss Norse, MS, ACSM CEP, Exercise Physiologist;Kendall Otis Peak, Exercise Physiologist;Diane Joya Gaskins, RN, BSN   ER physicians immediately available to respond to emergencies See telemetry face sheet for immediately available ER MD   Medication changes reported     No   Fall or balance concerns reported    No   Warm-up and Cool-down Performed on first and last piece of equipment   VAD Patient? No   Pain Assessment   Currently in Pain? No/denies   Multiple Pain Sites No           Exercise Prescription Changes - 06/23/15 0900    Exercise Review   Progression Yes   Response to Exercise   Symptoms None   Comments Adjusted [name]'s interval training to make it challenging. [name] was able to perform it with no problems and appreciates the individualized program.   Duration Progress to 30 minutes of continuous aerobic without signs/symptoms of physical distress   Intensity Rest + 30   Progression Continue progressive overload as per policy without signs/symptoms or physical distress.   Resistance Training   Training Prescription Yes   Weight 3   Reps 10-15   Interval Training   Interval Training Yes   Equipment Treadmill   Comments 3.4/ 2-5%   Treadmill   MPH 3.2   Grade 4  Interval training from 2-4%   Minutes 20   NuStep   Level 3   Watts 40   Minutes 20   REL-XR   Level 5   Watts 55   Minutes 15   T5 Nustep   Level 4   Watts 40   Minutes 15      Goals Met:  Independence with exercise equipment Exercise tolerated well Personal goals reviewed Strength training completed today  Goals Unmet:  Not Applicable  Goals Comments: Patient completed exercise prescription and all exercise goals during  rehab session. The exercise was tolerated well and the patient is progressing in the program.    Dr. Emily Filbert is Medical Director for [facility].

## 2015-06-25 NOTE — Progress Notes (Signed)
Cardiac Individual Treatment Plan  Patient Details  Name: Tommy [name] MRN: [id number] Date of Birth: 1950/09/13 Referring Provider:  Gustavo Lah, MD  Initial Encounter Date:    Visit Diagnosis: S/P CABG x 4  Patient's Home Medications on Admission:  Current outpatient prescriptions:  .  aspirin 81 MG chewable tablet, Chew 81 mg by mouth., Disp: , Rfl:  .  atorvastatin (LIPITOR) 80 MG tablet, Take 80 mg by mouth., Disp: , Rfl:  .  Glucosamine Sulfate 500 MG TABS, Take by mouth., Disp: , Rfl:  .  metoprolol tartrate (LOPRESSOR) 25 MG tablet, Take 25 mg by mouth., Disp: , Rfl:  .  Multiple Vitamin (MULTI-VITAMINS) TABS, Take by mouth., Disp: , Rfl:  .  nicotine (RA NICOTINE) 7 mg/24hr patch, Place onto the skin., Disp: , Rfl:  .  Omega-3 1000 MG CAPS, Take by mouth., Disp: , Rfl:  .  oxyCODONE (OXY IR/ROXICODONE) 5 MG immediate release tablet, Take 5 mg by mouth., Disp: , Rfl:   Past Medical History: No past medical history on file.  Tobacco Use: History  Smoking status  . Former Smoker  . Quit date: 02/23/2015  Smokeless tobacco  . Never Used    Labs: Recent Review Flowsheet Data    Labs for ITP Cardiac and Pulmonary Rehab Latest Ref Rng 08/22/2011 08/23/2011   Cholestrol 0-200 mg/dL - 184   LDLCALC 0-100 mg/dL - 124(H)   HDL 40-60 mg/dL - 38(L)   Trlycerides 0-200 mg/dL - 110   Hemoglobin A1c 4.2-6.3 % 7.1(H) -       Exercise Target Goals:    Exercise Program Goal: Individual exercise prescription set with THRR, safety & activity barriers. Participant demonstrates ability to understand and report RPE using BORG scale, to self-measure pulse accurately, and to acknowledge the importance of the exercise prescription.  Exercise Prescription Goal: Starting with aerobic activity 30 plus minutes a day, 3 days per week for initial exercise prescription. Provide home exercise prescription and guidelines that participant acknowledges understanding prior to  discharge.  Activity Barriers & Risk Stratification:     Activity Barriers & Risk Stratification - 03/16/15 1347    Activity Barriers & Risk Stratification   Activity Barriers Back Problems      6 Minute Walk:     6 Minute Walk      03/16/15 1412       6 Minute Walk   Phase Initial     Distance 1590 feet     Walk Time 6 minutes     Resting HR 83 bpm     Resting BP 126/74 mmHg     Max Ex. HR 102 bpm     Max Ex. BP 138/70 mmHg     RPE 11     Symptoms No        Initial Exercise Prescription:     Initial Exercise Prescription - 03/16/15 1400    Date of Initial Exercise Prescription   Date 03/16/15   Treadmill   MPH 3   Grade 0   Minutes 15   Bike   Level 1.4   Minutes 15   Recumbant Bike   Level 3   Watts 30   Minutes 15   NuStep   Level 4   Watts 50   Minutes 15   Arm Ergometer   Level 1   Watts 10   Minutes 10   Arm/Foot Ergometer   Level 1   Watts 15   Minutes 10  Cybex   Level 3   RPM 30   Minutes 15   Recumbant Elliptical   Level 2   Watts 25   Minutes 15   Elliptical   Level 1   Speed 3   Minutes 5   REL-XR   Level 3   Watts 30   Minutes 15   Prescription Details   Frequency (times per week) 3   Duration Progress to 30 minutes of continuous aerobic without signs/symptoms of physical distress   Intensity   THRR REST +  30   Ratings of Perceived Exertion 11-15   Progression Continue progressive overload as per policy without signs/symptoms or physical distress.   Resistance Training   Training Prescription Yes  ROM only if needed due to muscle soreness   Weight 1   Reps 10-12      Exercise Prescription Changes:     Exercise Prescription Changes      03/24/15 0900 03/31/15 0900 04/07/15 1400 04/14/15 1634 04/28/15 1000   Exercise Review   Progression Yes Yes No No No   Response to Exercise   Blood Pressure (Admit)   118/76 mmHg 118/80 mmHg    Blood Pressure (Exercise)   132/72 mmHg 140/72 mmHg    Blood Pressure  (Exit)   102/64 mmHg 98/60 mmHg    Heart Rate (Admit)   109 bpm 89 bpm    Heart Rate (Exercise)   123 bpm 128 bpm    Heart Rate (Exit)   87 bpm 96 bpm    Rating of Perceived Exertion (Exercise)   12 12    Symptoms  no no no None   Comments [name] did well his first of class with no signs or symptoms. I increased him to level 4 in the XR and he was able to complete that with no concerns.  I spoke with [name] about his increasing his speed on the treadmill and level on the Nustep. He completed those increases today in class without signs or symptoms.    Reviewed individualized exercise prescription and made increases per departmental policy. Exercise increases were discussed with the patient and they were able to perform the new work loads without issue (no signs or symptoms).    Duration   Progress to 30 minutes of continuous aerobic without signs/symptoms of physical distress Progress to 30 minutes of continuous aerobic without signs/symptoms of physical distress Progress to 30 minutes of continuous aerobic without signs/symptoms of physical distress   Intensity   Rest + 30 Rest + 30 Rest + 30   Progression   Continue progressive overload as per policy without signs/symptoms or physical distress. Continue progressive overload as per policy without signs/symptoms or physical distress. Continue progressive overload as per policy without signs/symptoms or physical distress.   Resistance Training   Training Prescription  Yes Yes Yes Yes   Weight  3 3 3 3    Reps  10-15 10-15 10-15 10-15   Interval Training   Interval Training  No No No No   Treadmill   MPH  3.2 3.2 3.2 3.2   Grade  0 0 0 1   Minutes  15 15 15 20    NuStep   Level  2 2 2 3    Watts  30 30 30  40   Minutes  15 15 15 20    REL-XR   Level 5 5 5 5 5    Watts 55 55 55 55 55   Minutes 15 15 15 15  15  05/05/15 0900 05/14/15 0900 05/21/15 1000 05/21/15 1454 06/11/15 0713   Exercise Review   Progression No Yes Yes Yes    Response to  Exercise   Blood Pressure (Admit)    116/64 mmHg 114/62 mmHg   Blood Pressure (Exercise)    128/76 mmHg 142/82 mmHg   Blood Pressure (Exit)    116/70 mmHg 106/62 mmHg   Heart Rate (Admit)    76 bpm 96 bpm   Heart Rate (Exercise)    110 bpm 120 bpm   Heart Rate (Exit)    85 bpm 78 bpm   Rating of Perceived Exertion (Exercise)    12 12   Symptoms None None None None None   Comments [name] will be traveling for the next week. I reviewed with him things he can do while he is traveling in order to maintain his exercise progress. He plans on doing a lot of walking and staying active while he is away. [name] will be traveling for the next week. I reviewed with him things he can do while he is traveling in order to maintain his exercise progress. He plans on doing a lot of walking and staying active while he is away. Patient started interval training. Interval training guidelines were discussed with the patient. He tolerated these increases with no signs or symptoms.      Duration Progress to 30 minutes of continuous aerobic without signs/symptoms of physical distress Progress to 30 minutes of continuous aerobic without signs/symptoms of physical distress Progress to 30 minutes of continuous aerobic without signs/symptoms of physical distress Progress to 30 minutes of continuous aerobic without signs/symptoms of physical distress Progress to 30 minutes of continuous aerobic without signs/symptoms of physical distress   Intensity Rest + 30 Rest + 30 Rest + 30 Rest + 30 Rest + 30   Progression Continue progressive overload as per policy without signs/symptoms or physical distress. Continue progressive overload as per policy without signs/symptoms or physical distress. Continue progressive overload as per policy without signs/symptoms or physical distress. Continue progressive overload as per policy without signs/symptoms or physical distress. Continue progressive overload as per policy without signs/symptoms or  physical distress.   Resistance Training   Training Prescription Yes Yes Yes Yes Yes   Weight 3 3 3 3 3    Reps 10-15 10-15 10-15 10-15 10-15   Interval Training   Interval Training No No Yes Yes Yes   Equipment   Treadmill Treadmill Treadmill   Comments   3.2/ 2-4% 3.2/ 2-4% 3.2/ 2-4%   Treadmill   MPH 3.2 3.2 3.2 3.2 3.2   Grade 1 2 4   Interval training from 2-4% 4  Interval training from 2-4% 4  Interval training from 2-4%   Minutes 20 20 20 20 20    NuStep   Level 3 3 3 3 3    Watts 40 40 40 40 40   Minutes 20 20 20 20 20    REL-XR   Level 5 5 5 5 5    Watts 55 55 55 55 55   Minutes 15 15 15 15 15    T5 Nustep   Level  4 4 4 4    Watts  40 40 40 40   Minutes  15 15 15 15      06/16/15 0709 06/23/15 0900         Exercise Review   Progression  Yes      Response to Exercise   Blood Pressure (Admit) 114/62 mmHg  Blood Pressure (Exercise) 142/82 mmHg       Blood Pressure (Exit) 106/62 mmHg       Heart Rate (Admit) 96 bpm       Heart Rate (Exercise) 120 bpm       Heart Rate (Exit) 78 bpm       Rating of Perceived Exertion (Exercise) 12       Symptoms None None      Comments  Adjusted [name]'s interval training to make it challenging. [name] was able to perform it with no problems and appreciates the individualized program.      Duration Progress to 30 minutes of continuous aerobic without signs/symptoms of physical distress Progress to 30 minutes of continuous aerobic without signs/symptoms of physical distress      Intensity Rest + 30 Rest + 30      Progression Continue progressive overload as per policy without signs/symptoms or physical distress. Continue progressive overload as per policy without signs/symptoms or physical distress.      Resistance Training   Training Prescription Yes Yes      Weight 3 3      Reps 10-15 10-15      Interval Training   Interval Training Yes Yes      Equipment Treadmill Treadmill      Comments 3.2/ 2-4% 3.4/ 2-5%      Treadmill   MPH  3.2 3.2      Grade 4  Interval training from 2-4% 4  Interval training from 2-4%      Minutes 20 20      NuStep   Level 3 3      Watts 40 40      Minutes 20 20      REL-XR   Level 5 5      Watts 55 55      Minutes 15 15      T5 Nustep   Level 4 4      Watts 40 40      Minutes 15 15         Discharge Exercise Prescription (Final Exercise Prescription Changes):     Exercise Prescription Changes - 06/23/15 0900    Exercise Review   Progression Yes   Response to Exercise   Symptoms None   Comments Adjusted [name]'s interval training to make it challenging. [name] was able to perform it with no problems and appreciates the individualized program.   Duration Progress to 30 minutes of continuous aerobic without signs/symptoms of physical distress   Intensity Rest + 30   Progression Continue progressive overload as per policy without signs/symptoms or physical distress.   Resistance Training   Training Prescription Yes   Weight 3   Reps 10-15   Interval Training   Interval Training Yes   Equipment Treadmill   Comments 3.4/ 2-5%   Treadmill   MPH 3.2   Grade 4  Interval training from 2-4%   Minutes 20   NuStep   Level 3   Watts 40   Minutes 20   REL-XR   Level 5   Watts 55   Minutes 15   T5 Nustep   Level 4   Watts 40   Minutes 15      Nutrition:  Target Goals: Understanding of nutrition guidelines, daily intake of sodium <1510m, cholesterol <2068m calories 30% from fat and 7% or less from saturated fats, daily to have 5 or more servings of fruits and vegetables.  Biometrics:  Pre Biometrics - 03/16/15 1419    Pre Biometrics   Height 5' 10.25" (1.784 m)   Weight 183 lb 1.6 oz (83.054 kg)   Waist Circumference 37 inches   Hip Circumference 40 inches   Waist to Hip Ratio 0.92 %   BMI (Calculated) 26.1       Nutrition Therapy Plan and Nutrition Goals:   Nutrition Discharge: Rate Your Plate Scores:   Nutrition Goals Re-Evaluation:      Nutrition Goals Re-Evaluation      04/08/15 0923           Personal Goal #1 Re-Evaluation   Personal Goal #1 [name] eats healthy. Has a good BMI also.        Goal Progress Seen Yes          Psychosocial: Target Goals: Acknowledge presence or absence of depression, maximize coping skills, provide positive support system. Participant is able to verbalize types and ability to use techniques and skills needed for reducing stress and depression.  Initial Review & Psychosocial Screening:     Initial Psych Review & Screening - 03/16/15 1444    Screening Interventions   Interventions Encouraged to exercise      Quality of Life Scores:   PHQ-9:     Recent Review Flowsheet Data    Depression screen [facility] 2/9 03/16/2015   Decreased Interest 0   Down, Depressed, Hopeless 1   PHQ - 2 Score 1   Altered sleeping 1   Tired, decreased energy 1   Change in appetite 1   Feeling bad or failure about yourself  1   Trouble concentrating 0   Moving slowly or fidgety/restless 0   Suicidal thoughts 0   PHQ-9 Score 5   Difficult doing work/chores Not difficult at all      Psychosocial Evaluation and Intervention:     Psychosocial Evaluation - 03/31/15 0954    Psychosocial Evaluation & Interventions   Interventions Stress management education;Relaxation education;Encouraged to exercise with the program and follow exercise prescription   Comments Counselor met with Mr. Amos today.  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  He has a strong support system with a spouse of 71 years and an adult son and his significant other who live in the home with them.  He reports sleeping better lately and his appetite is good.  He also states he is typically positive, although he has had some post surgery depressive symtpoms that are making it difficult for him to quit smoking as a coping mechanism.  Counselor recommended he speak with his doctor about possible  medication for these depressive symptoms to help treat this as well as decrease the need to smoke.  Mr. Barham has goals to quit smoking and to get strong enough to get back on the golf course in the spring and win the club tournament!     Continued Psychosocial Services Needed Yes  Mr. Burggraf will benefit from talking with his doctor about medication for mood symptoms  as well as participating in the psychoeducational components of this program.        Psychosocial Re-Evaluation:     Psychosocial Re-Evaluation      04/08/15 0924           Psychosocial Re-Evaluation   Interventions Encouraged to attend Cardiac Rehabilitation for the exercise          Vocational Rehabilitation: Provide vocational rehab assistance to qualifying candidates.   Vocational Rehab Evaluation &  Intervention:     Vocational Rehab - 03/16/15 1350    Initial Vocational Rehab Evaluation & Intervention   Assessment shows need for Vocational Rehabilitation No      Education: Education Goals: Education classes will be provided on a weekly basis, covering required topics. Participant will state understanding/return demonstration of topics presented.  Learning Barriers/Preferences:     Learning Barriers/Preferences - 03/16/15 1350    Learning Barriers/Preferences   Learning Barriers None   Learning Preferences None      Education Topics: General Nutrition Guidelines/Fats and Fiber: -Group instruction provided by verbal, written material, models and posters to present the general guidelines for heart healthy nutrition. Gives an explanation and review of dietary fats and fiber.          Cardiac Rehab from 06/18/2015 in [facility] Cardiac and Pulmonary Rehab   Date  05/05/15   Educator  PI   Instruction Review Code  2- meets goals/outcomes      Controlling Sodium/Reading Food Labels: -Group verbal and written material supporting the discussion of sodium use in heart healthy nutrition. Review and  explanation with models, verbal and written materials for utilization of the food label.   Exercise Physiology & Risk Factors: - Group verbal and written instruction with models to review the exercise physiology of the cardiovascular system and associated critical values. Details cardiovascular disease risk factors and the goals associated with each risk factor.      Cardiac Rehab from 06/18/2015 in [facility] Cardiac and Pulmonary Rehab   Date  06/04/15   Educator  SW   Instruction Review Code  2- meets goals/outcomes      Aerobic Exercise & Resistance Training: - Gives group verbal and written discussion on the health impact of inactivity. On the components of aerobic and resistive training programs and the benefits of this training and how to safely progress through these programs.      Cardiac Rehab from 06/18/2015 in [facility] Cardiac and Pulmonary Rehab   Date  04/07/15   Educator  RM   Instruction Review Code  2- meets goals/outcomes      Flexibility, Balance, General Exercise Guidelines: - Provides group verbal and written instruction on the benefits of flexibility and balance training programs. Provides general exercise guidelines with specific guidelines to those with heart or lung disease. Demonstration and skill practice provided.      Cardiac Rehab from 06/18/2015 in [facility] Cardiac and Pulmonary Rehab   Date  04/14/15   Educator  RM   Instruction Review Code  2- meets goals/outcomes      Stress Management: - Provides group verbal and written instruction about the health risks of elevated stress, cause of high stress, and healthy ways to reduce stress.      Cardiac Rehab from 06/18/2015 in [facility] Cardiac and Pulmonary Rehab   Date  06/16/15   Educator  C.Enterkin, RN   Instruction Review Code  2- meets goals/outcomes      Depression: - Provides group verbal and written instruction on the correlation between heart/lung disease and depressed mood, treatment options, and the stigmas  associated with seeking treatment.      Cardiac Rehab from 06/18/2015 in [facility] Cardiac and Pulmonary Rehab   Date  05/14/15   Educator  C. Enterkin. RN   Instruction Review Code  2- meets goals/outcomes      Anatomy & Physiology of the Heart: - Group verbal and written instruction and models provide basic cardiac anatomy and physiology, with the coronary electrical and  arterial systems. Review of: AMI, Angina, Valve disease, Heart Failure, Cardiac Arrhythmia, Pacemakers, and the ICD.      Cardiac Rehab from 06/18/2015 in [facility] Cardiac and Pulmonary Rehab   Date  06/18/15   Educator  CE   Instruction Review Code  2- meets goals/outcomes      Cardiac Procedures: - Group verbal and written instruction and models to describe the testing methods done to diagnose heart disease. Reviews the outcomes of the test results. Describes the treatment choices: Medical Management, Angioplasty, or Coronary Bypass Surgery.      Cardiac Rehab from 06/18/2015 in [facility] Cardiac and Pulmonary Rehab   Date  03/24/15   Educator  DW   Instruction Review Code  2- meets goals/outcomes      Cardiac Medications: - Group verbal and written instruction to review commonly prescribed medications for heart disease. Reviews the medication, class of the drug, and side effects. Includes the steps to properly store meds and maintain the prescription regimen.   Go Sex-Intimacy & Heart Disease, Get SMART - Goal Setting: - Group verbal and written instruction through game format to discuss heart disease and the return to sexual intimacy. Provides group verbal and written material to discuss and apply goal setting through the application of the S.M.A.R.T. Method.      Cardiac Rehab from 06/18/2015 in [facility] Cardiac and Pulmonary Rehab   Date  03/24/15   Educator  DW   Instruction Review Code  2- meets goals/outcomes      Other Matters of the Heart: - Provides group verbal, written materials and models to describe Heart Failure,  Angina, Valve Disease, and Diabetes in the realm of heart disease. Includes description of the disease process and treatment options available to the cardiac patient.   Exercise & Equipment Safety: - Individual verbal instruction and demonstration of equipment use and safety with use of the equipment.      Cardiac Rehab from 06/18/2015 in [facility] Cardiac and Pulmonary Rehab   Date  03/16/15   Educator  Sb   Instruction Review Code  2- meets goals/outcomes      Infection Prevention: - Provides verbal and written material to individual with discussion of infection control including proper hand washing and proper equipment cleaning during exercise session.      Cardiac Rehab from 06/18/2015 in [facility] Cardiac and Pulmonary Rehab   Date  03/16/15   Educator  SB   Instruction Review Code  2- meets goals/outcomes      Falls Prevention: - Provides verbal and written material to individual with discussion of falls prevention and safety.      Cardiac Rehab from 06/18/2015 in [facility] Cardiac and Pulmonary Rehab   Date  03/16/15   Educator  sb   Instruction Review Code  2- meets goals/outcomes      Diabetes: - Individual verbal and written instruction to review signs/symptoms of diabetes, desired ranges of glucose level fasting, after meals and with exercise. Advice that pre and post exercise glucose checks will be done for 3 sessions at entry of program.    Knowledge Questionnaire Score:   Personal Goals and Risk Factors at Admission:     Personal Goals and Risk Factors at Admission - 03/16/15 1353    Personal Goals and Risk Factors on Admission    Weight Management Yes   Intervention Learn and follow the exercise and diet guidelines while in the program. Utilize the nutrition and education classes to help gain knowledge of the diet and exercise  expectations in the program  HAs been working on weight loss for diabetes prevention.   Admit Weight 183 lb (83.008 kg)   Goal Weight 185 lb (83.915  kg)   Increase Aerobic Exercise and Physical Activity Yes  Is walking 4 days a week at least 30 min.   Intervention While in program, learn and follow the exercise prescription taught. Start at a low level workload and increase workload after able to maintain previous level for 30 minutes. Increase time before increasing intensity.   Take Less Medication Yes   Intervention Learn your risk factors and begin the lifestyle modifications for risk factor control during your time in the program.   Understand more about Heart/Pulmonary Disease. Yes   Intervention While in program utilize professionals for any questions, and attend the education sessions. Great websites to use are www.americanheart.org or www.lung.org for reliable information.   Diabetes No   Hypertension Yes   Goal Participant will see blood pressure controlled within the values of 140/75m/Hg or within value directed by their physician.   Intervention Provide nutrition & aerobic exercise along with prescribed medications to achieve BP 140/90 or less.   Lipids Yes   Goal Cholesterol controlled with medications as prescribed, with individualized exercise RX and with personalized nutrition plan. Value goals: LDL < 773m HDL > 4016mParticipant states understanding of desired cholesterol values and following prescriptions.   Intervention Provide nutrition & aerobic exercise along with prescribed medications to achieve LDL <32m72mDL >40mg52mStress No      Personal Goals and Risk Factors Review:      Goals and Risk Factor Review      04/08/15 0923 04/28/15 1111 05/27/15 0904 06/18/15 1016     Weight Management   Goals Progress/Improvement seen  Yes  Yes    Comments  Weight 184 today.  Patient feels the best when his weight is between 183 - 188.  He tries to keep his weight less than 190, which it is today, as he feels the best when it is less than 190 pounds.    Doing well with maintaining weight.    Increase Aerobic Exercise and  Physical Activity   Goals Progress/Improvement seen  Yes Yes Yes Yes    Comments [name] Marden Nobleincreased his Cardiac Rehab workloads.  In addition to coming to Cardiac Rehab on Tuesday and Thursday mornings, [name] walks every 3/4th mile every morning.  [name] Marden Nobleed his anxiety has improved with exercise, as he is finding he does not need his anti-anxiety meds as often when he exercises.  Patient stated he is also sleeping better.   [name] Marden Nobleinues to walk at home and is doing very well in the program. He has made increases on all of the equipment and he can continuously exercise for the entire class time. We will continue to progress him by increasing his intensity. He has been encouraged to continue with his home exercise and we will follow up with him each month to ensure he continues to accumulate exercise at home.  Feels much better than at the beginning of the program, looking very forward to playing golf hopefully in March, as the weather warms.  Also going to try to maintain exercise regimen 2-3x a week    Hypertension   Goal  --  Patient's BP the last 3 sessions has been 98 - 124 [id number] and 60 - 80  diastolic.    Participant will see blood pressure controlled within the values of 140/90mm/14mr within  value directed by their physician.    Abnormal Lipids   Goal  Cholesterol controlled with medications as prescribed, with individualized exercise RX and with personalized nutrition plan. Value goals: LDL < 60m, HDL > 416m Participant states understanding of desired cholesterol values and following prescriptions.  Cholesterol medication as prescribed.  No new lab values at this time.  Continue medication for cholesterol as prescribed, diet, and exercise plan.           Personal Goals Discharge (Final Personal Goals and Risk Factors Review):      Goals and Risk Factor Review - 06/18/15 1016    Weight Management   Goals Progress/Improvement seen Yes   Comments Doing well with maintaining weight.    Increase Aerobic Exercise and Physical Activity   Goals Progress/Improvement seen  Yes   Comments Feels much better than at the beginning of the program, looking very forward to playing golf hopefully in March, as the weather warms.  Also going to try to maintain exercise regimen 2-3x a week   Hypertension   Goal Participant will see blood pressure controlled within the values of 140/9057mg or within value directed by their physician.      ITP Comments:     ITP Comments      05/05/15 1349 06/25/15 1313         ITP Comments 30 day review preparation  Continue with ITP Ready for 30 day review. Continue with ITP. only 4 visits since last review         Comments:

## 2015-06-30 DIAGNOSIS — Z951 Presence of aortocoronary bypass graft: Secondary | ICD-10-CM

## 2015-06-30 NOTE — Progress Notes (Signed)
Daily Session Note  Patient Details  Name: Tommy [name] MRN: [id number] Date of Birth: [birth year] Referring Provider:  Gustavo Lah, MD  Encounter Date: 06/30/2015  Check In:     Session Check In - 06/30/15 0916    Check-In   Staff Present Candiss Norse, MS, ACSM CEP, Exercise Physiologist;Diane Joya Gaskins, RN, Apolonio Schneiders, BS, Exercise Physiologist   ER physicians immediately available to respond to emergencies See telemetry face sheet for immediately available ER MD   Medication changes reported     No   Fall or balance concerns reported    No   Warm-up and Cool-down Performed on first and last piece of equipment   VAD Patient? No   Pain Assessment   Currently in Pain? No/denies   Multiple Pain Sites No         Goals Met:  Independence with exercise equipment Exercise tolerated well No report of cardiac concerns or symptoms Strength training completed today  Goals Unmet:  Not Applicable  Goals Comments:    Dr. Emily Filbert is Medical Director for [place] and [facility].

## 2015-07-01 NOTE — Addendum Note (Signed)
Addended by: BICE, SUSANNE P on: 07/01/2015 07:39 AM   Modules accepted: Orders  

## 2015-07-02 ENCOUNTER — Encounter: Payer: 59 | Attending: Cardiothoracic Surgery | Admitting: *Deleted

## 2015-07-02 DIAGNOSIS — Z951 Presence of aortocoronary bypass graft: Secondary | ICD-10-CM | POA: Insufficient documentation

## 2015-07-07 ENCOUNTER — Encounter: Payer: 59 | Admitting: *Deleted

## 2015-07-07 DIAGNOSIS — Z951 Presence of aortocoronary bypass graft: Secondary | ICD-10-CM

## 2015-07-07 NOTE — Progress Notes (Signed)
Daily Session Note  Patient Details  Name: Obediah Welles MRN: [id number] Date of Birth: 15-Aug-1950 Referring Provider:  Ria Bush, MD  Encounter Date: 07/07/2015  Check In:     Session Check In - 07/07/15 0845    Check-In   Location [facility]   Staff Present Gerlene Burdock, RN, Drusilla Kanner, MS, ACSM CEP, Exercise Physiologist;Diane Joya Gaskins, RN, BSN   Supervising physician immediately available to respond to emergencies See telemetry face sheet for immediately available ER MD   Medication changes reported     No   Fall or balance concerns reported    No   Warm-up and Cool-down Performed on first and last piece of equipment   Resistance Training Performed Yes   VAD Patient? No   Pain Assessment   Currently in Pain? No/denies         Goals Met:  Proper associated with RPD/PD & O2 Sat Exercise tolerated well  Goals Unmet:  Not Applicable  Goals Comments: [name] is doing well in Cardiac Rehab.    Dr. Emily Filbert is Medical Director for [place] [facility].

## 2015-07-09 ENCOUNTER — Encounter: Payer: 59 | Admitting: *Deleted

## 2015-07-09 DIAGNOSIS — Z951 Presence of aortocoronary bypass graft: Secondary | ICD-10-CM

## 2015-07-09 NOTE — Progress Notes (Signed)
Daily Session Note  Patient Details  Name: Tommy [name] MRN: [id number] Date of Birth: Mar 28, 1951 Referring Provider:  Gustavo Lah, MD  Encounter Date: 07/09/2015  Check In:     Session Check In - 07/09/15 0907    Check-In   Staff Present Heath Lark, RN, BSN, CCRP;Carroll Enterkin, RN, Jana Half, RN, BSN   Supervising physician immediately available to respond to emergencies See telemetry face sheet for immediately available ER MD   Medication changes reported     No   Fall or balance concerns reported    No   Warm-up and Cool-down Performed on first and last piece of equipment   Resistance Training Performed No   VAD Patient? No   Pain Assessment   Currently in Pain? No/denies           Exercise Prescription Changes - 07/09/15 0900    Exercise Review   Progression Yes   Response to Exercise   Symptoms None   Comments [name] WAS ASKED TO ADD 2 DAYS A WEEK AT HOME TO HIS EXERCISE REGIMEN   Frequency Add 2 additional days to program exercise sessions.   Duration Progress to 30 minutes of continuous aerobic without signs/symptoms of physical distress   Intensity Rest + 30   Progression Continue progressive overload as per policy without signs/symptoms or physical distress.   Resistance Training   Training Prescription Yes   Weight 3   Reps 10-15   Interval Training   Interval Training Yes   Equipment Treadmill   Comments 3.4/ 2-5%   Treadmill   MPH 3.2   Grade 4  Interval training from 2-4%   Minutes 20   NuStep   Level 3   Watts 40   Minutes 20   REL-XR   Level 5   Watts 55   Minutes 15   T5 Nustep   Level 4   Watts 40   Minutes 15      Goals Met:  Exercise tolerated well No report of cardiac concerns or symptoms  Goals Unmet:  Not Applicable  Goals Comments: Doing well with exercise prescription progression.    Dr. Emily Filbert is Medical Director for [place] [facility].

## 2015-07-21 NOTE — Progress Notes (Signed)
Cardiac Individual Treatment Plan  Patient Details  Name: Tommy [name] MRN: [id number] Date of Birth: [birth year] Referring Provider:  Gustavo Lah, MD  Initial Encounter Date:    Visit Diagnosis: S/P CABG x 4  Patient's Home Medications on Admission:  Current outpatient prescriptions:  .  aspirin 81 MG chewable tablet, Chew 81 mg by mouth., Disp: , Rfl:  .  atorvastatin (LIPITOR) 80 MG tablet, Take 80 mg by mouth., Disp: , Rfl:  .  Glucosamine Sulfate 500 MG TABS, Take by mouth., Disp: , Rfl:  .  metoprolol tartrate (LOPRESSOR) 25 MG tablet, Take 25 mg by mouth., Disp: , Rfl:  .  Multiple Vitamin (MULTI-VITAMINS) TABS, Take by mouth., Disp: , Rfl:  .  nicotine (RA NICOTINE) 7 mg/24hr patch, Place onto the skin., Disp: , Rfl:  .  Omega-3 1000 MG CAPS, Take by mouth., Disp: , Rfl:  .  oxyCODONE (OXY IR/ROXICODONE) 5 MG immediate release tablet, Take 5 mg by mouth., Disp: , Rfl:   Past Medical History: No past medical history on file.  Tobacco Use: History  Smoking status  . Former Smoker  . Quit date: 02/23/2015  Smokeless tobacco  . Never Used    Labs: Recent Review Flowsheet Data    Labs for ITP Cardiac and Pulmonary Rehab Latest Ref Rng 08/22/2011 08/23/2011   Cholestrol 0-200 mg/dL - 184   LDLCALC 0-100 mg/dL - 124(H)   HDL 40-60 mg/dL - 38(L)   Trlycerides 0-200 mg/dL - 110   Hemoglobin A1c 4.2-6.3 % 7.1(H) -       Exercise Target Goals:    Exercise Program Goal: Individual exercise prescription set with THRR, safety & activity barriers. Participant demonstrates ability to understand and report RPE using BORG scale, to self-measure pulse accurately, and to acknowledge the importance of the exercise prescription.  Exercise Prescription Goal: Starting with aerobic activity 30 plus minutes a day, 3 days per week for initial exercise prescription. Provide home exercise prescription and guidelines that participant acknowledges understanding prior to  discharge.  Activity Barriers & Risk Stratification:     Activity Barriers & Cardiac Risk Stratification - 03/16/15 1347    Activity Barriers & Cardiac Risk Stratification   Activity Barriers Back Problems      6 Minute Walk:     6 Minute Walk      03/16/15 1412       6 Minute Walk   Phase Initial     Distance 1590 feet     Walk Time 6 minutes     RPE 11     Symptoms No     Resting HR 83 bpm     Resting BP 126/74 mmHg     Max Ex. HR 102 bpm     Max Ex. BP 138/70 mmHg        Initial Exercise Prescription:     Initial Exercise Prescription - 03/16/15 1400    Date of Initial Exercise Prescription   Date 03/16/15   Treadmill   MPH 3   Grade 0   Minutes 15   Bike   Level 1.4   Minutes 15   Recumbant Bike   Level 3   Watts 30   Minutes 15   NuStep   Level 4   Watts 50   Minutes 15   Arm Ergometer   Level 1   Watts 10   Minutes 10   Arm/Foot Ergometer   Level 1   Watts 15   Minutes 10  Cybex   Level 3   RPM 30   Minutes 15   Recumbant Elliptical   Level 2   Watts 25   Minutes 15   Elliptical   Level 1   Speed 3   Minutes 5   REL-XR   Level 3   Watts 30   Minutes 15   Prescription Details   Frequency (times per week) 3   Duration Progress to 30 minutes of continuous aerobic without signs/symptoms of physical distress   Intensity   THRR REST +  30   Ratings of Perceived Exertion 11-15   Progression Continue progressive overload as per policy without signs/symptoms or physical distress.   Resistance Training   Training Prescription Yes  ROM only if needed due to muscle soreness   Weight 1   Reps 10-12      Exercise Prescription Changes:     Exercise Prescription Changes      03/24/15 0900 03/31/15 0900 04/07/15 1400 04/14/15 1634 04/28/15 1000   Exercise Review   Progression Yes Yes No No No   Response to Exercise   Blood Pressure (Admit)   118/76 mmHg 118/80 mmHg    Blood Pressure (Exercise)   132/72 mmHg 140/72 mmHg     Blood Pressure (Exit)   102/64 mmHg 98/60 mmHg    Heart Rate (Admit)   109 bpm 89 bpm    Heart Rate (Exercise)   123 bpm 128 bpm    Heart Rate (Exit)   87 bpm 96 bpm    Rating of Perceived Exertion (Exercise)   12 12    Symptoms  no no no None   Comments [name] did well his first of class with no signs or symptoms. I increased him to level 4 in the XR and he was able to complete that with no concerns.  I spoke with [name] about his increasing his speed on the treadmill and level on the Nustep. He completed those increases today in class without signs or symptoms.    Reviewed individualized exercise prescription and made increases per departmental policy. Exercise increases were discussed with the patient and they were able to perform the new work loads without issue (no signs or symptoms).    Duration   Progress to 30 minutes of continuous aerobic without signs/symptoms of physical distress Progress to 30 minutes of continuous aerobic without signs/symptoms of physical distress Progress to 30 minutes of continuous aerobic without signs/symptoms of physical distress   Intensity   Rest + 30 Rest + 30 Rest + 30   Progression   Continue progressive overload as per policy without signs/symptoms or physical distress. Continue progressive overload as per policy without signs/symptoms or physical distress. Continue progressive overload as per policy without signs/symptoms or physical distress.   Resistance Training   Training Prescription  Yes Yes Yes Yes   Weight  3 3 3 3    Reps  10-15 10-15 10-15 10-15   Interval Training   Interval Training  No No No No   Treadmill   MPH  3.2 3.2 3.2 3.2   Grade  0 0 0 1   Minutes  15 15 15 20    NuStep   Level  2 2 2 3    Watts  30 30 30  40   Minutes  15 15 15 20    REL-XR   Level 5 5 5 5 5    Watts 55 55 55 55 55   Minutes 15 15 15 15  15  05/05/15 0900 05/14/15 0900 05/21/15 1000 05/21/15 1454 06/11/15 0713   Exercise Review   Progression No Yes Yes Yes     Response to Exercise   Blood Pressure (Admit)    116/64 mmHg 114/62 mmHg   Blood Pressure (Exercise)    128/76 mmHg 142/82 mmHg   Blood Pressure (Exit)    116/70 mmHg 106/62 mmHg   Heart Rate (Admit)    76 bpm 96 bpm   Heart Rate (Exercise)    110 bpm 120 bpm   Heart Rate (Exit)    85 bpm 78 bpm   Rating of Perceived Exertion (Exercise)    12 12   Symptoms None None None None None   Comments [name] will be traveling for the next week. I reviewed with him things he can do while he is traveling in order to maintain his exercise progress. He plans on doing a lot of walking and staying active while he is away. [name] will be traveling for the next week. I reviewed with him things he can do while he is traveling in order to maintain his exercise progress. He plans on doing a lot of walking and staying active while he is away. Patient started interval training. Interval training guidelines were discussed with the patient. He tolerated these increases with no signs or symptoms.      Duration Progress to 30 minutes of continuous aerobic without signs/symptoms of physical distress Progress to 30 minutes of continuous aerobic without signs/symptoms of physical distress Progress to 30 minutes of continuous aerobic without signs/symptoms of physical distress Progress to 30 minutes of continuous aerobic without signs/symptoms of physical distress Progress to 30 minutes of continuous aerobic without signs/symptoms of physical distress   Intensity Rest + 30 Rest + 30 Rest + 30 Rest + 30 Rest + 30   Progression Continue progressive overload as per policy without signs/symptoms or physical distress. Continue progressive overload as per policy without signs/symptoms or physical distress. Continue progressive overload as per policy without signs/symptoms or physical distress. Continue progressive overload as per policy without signs/symptoms or physical distress. Continue progressive overload as per policy without  signs/symptoms or physical distress.   Resistance Training   Training Prescription Yes Yes Yes Yes Yes   Weight 3 3 3 3 3    Reps 10-15 10-15 10-15 10-15 10-15   Interval Training   Interval Training No No Yes Yes Yes   Equipment   Treadmill Treadmill Treadmill   Comments   3.2/ 2-4% 3.2/ 2-4% 3.2/ 2-4%   Treadmill   MPH 3.2 3.2 3.2 3.2 3.2   Grade 1 2 4   Interval training from 2-4% 4  Interval training from 2-4% 4  Interval training from 2-4%   Minutes 20 20 20 20 20    NuStep   Level 3 3 3 3 3    Watts 40 40 40 40 40   Minutes 20 20 20 20 20    REL-XR   Level 5 5 5 5 5    Watts 55 55 55 55 55   Minutes 15 15 15 15 15    T5 Nustep   Level  4 4 4 4    Watts  40 40 40 40   Minutes  15 15 15 15      06/16/15 0709 06/23/15 0900 07/07/15 0900 07/09/15 0900     Exercise Review   Progression  Yes Yes Yes    Response to Exercise   Blood Pressure (Admit) 114/62 mmHg   120/70 mmHg  Blood Pressure (Exercise) 142/82 mmHg   166/82 mmHg    Blood Pressure (Exit) 106/62 mmHg   104/68 mmHg    Heart Rate (Admit) 96 bpm   82 bpm    Heart Rate (Exercise) 120 bpm   132 bpm    Heart Rate (Exit) 78 bpm   94 bpm    Rating of Perceived Exertion (Exercise) 12   12    Symptoms None None None None    Comments  Adjusted [name]'s interval training to make it challenging. [name] was able to perform it with no problems and appreciates the individualized program. Adjusted [name]'s interval training to make it challenging. [name] was able to perform it with no problems and appreciates the individualized program. [name] WAS ASKED TO ADD 2 DAYS A WEEK AT [place]    Frequency    Add 2 additional days to program exercise sessions.    Duration Progress to 30 minutes of continuous aerobic without signs/symptoms of physical distress Progress to 30 minutes of continuous aerobic without signs/symptoms of physical distress Progress to 30 minutes of continuous aerobic without signs/symptoms of physical distress  Progress to 30 minutes of continuous aerobic without signs/symptoms of physical distress    Intensity Rest + 30 Rest + 30 Rest + 30 Rest + 30    Progression Continue progressive overload as per policy without signs/symptoms or physical distress. Continue progressive overload as per policy without signs/symptoms or physical distress. Continue progressive overload as per policy without signs/symptoms or physical distress. Continue progressive overload as per policy without signs/symptoms or physical distress.    Resistance Training   Training Prescription Yes Yes Yes Yes    Weight 3 3 3 3     Reps 10-15 10-15 10-15 10-15    Interval Training   Interval Training Yes Yes Yes Yes    Equipment Treadmill Treadmill Treadmill Treadmill    Comments 3.2/ 2-4% 3.4/ 2-5% 3.4/ 2-5% 3.4/ 2-5%    Treadmill   MPH 3.2 3.2 3.2 3.5    Grade 4  Interval training from 2-4% 4  Interval training from 2-4% 4  Interval training from 2-4% 5  Interval training from 2-5%    Minutes 20 20 20 20     NuStep   Level 3 3 3 3     Watts 40 40 40 40    Minutes 20 20 20 20     REL-XR   Level 5 5 5 5     Watts 55 55 55 55    Minutes 15 15 15 15     T5 Nustep   Level 4 4 4 5     Watts 40 40 40 50    Minutes 15 15 15 15        Discharge Exercise Prescription (Final Exercise Prescription Changes):     Exercise Prescription Changes - 07/09/15 0900    Exercise Review   Progression Yes   Response to Exercise   Blood Pressure (Admit) 120/70 mmHg   Blood Pressure (Exercise) 166/82 mmHg   Blood Pressure (Exit) 104/68 mmHg   Heart Rate (Admit) 82 bpm   Heart Rate (Exercise) 132 bpm   Heart Rate (Exit) 94 bpm   Rating of Perceived Exertion (Exercise) 12   Symptoms None   Comments [name] WAS ASKED TO ADD 2 DAYS A WEEK AT HOME TO HIS EXERCISE REGIMEN   Frequency Add 2 additional days to program exercise sessions.   Duration Progress to 30 minutes of continuous aerobic without signs/symptoms of physical distress  Intensity  Rest + 30   Progression Continue progressive overload as per policy without signs/symptoms or physical distress.   Resistance Training   Training Prescription Yes   Weight 3   Reps 10-15   Interval Training   Interval Training Yes   Equipment Treadmill   Comments 3.4/ 2-5%   Treadmill   MPH 3.5   Grade 5  Interval training from 2-5%   Minutes 20   NuStep   Level 3   Watts 40   Minutes 20   REL-XR   Level 5   Watts 55   Minutes 15   T5 Nustep   Level 5   Watts 50   Minutes 15      Nutrition:  Target Goals: Understanding of nutrition guidelines, daily intake of sodium <1529m, cholesterol <2027m calories 30% from fat and 7% or less from saturated fats, daily to have 5 or more servings of fruits and vegetables.  Biometrics:     Pre Biometrics - 03/16/15 1419    Pre Biometrics   Height 5' 10.25" (1.784 m)   Weight 183 lb 1.6 oz (83.054 kg)   Waist Circumference 37 inches   Hip Circumference 40 inches   Waist to Hip Ratio 0.92 %   BMI (Calculated) 26.1       Nutrition Therapy Plan and Nutrition Goals:   Nutrition Discharge: Rate Your Plate Scores:   Nutrition Goals Re-Evaluation:     Nutrition Goals Re-Evaluation      04/08/15 0923           Personal Goal #1 Re-Evaluation   Personal Goal #1 [name] eats healthy. Has a good BMI also.        Goal Progress Seen Yes          Psychosocial: Target Goals: Acknowledge presence or absence of depression, maximize coping skills, provide positive support system. Participant is able to verbalize types and ability to use techniques and skills needed for reducing stress and depression.  Initial Review & Psychosocial Screening:     Initial Psych Review & Screening - 03/16/15 1444    Screening Interventions   Interventions Encouraged to exercise      Quality of Life Scores:   PHQ-9:     Recent Review Flowsheet Data    [facility]   Decreased Interest 0   Down, Depressed,  Hopeless 1   PHQ - 2 Score 1   Altered sleeping 1   Tired, decreased energy 1   Change in appetite 1   Feeling bad or failure about yourself  1   Trouble concentrating 0   Moving slowly or fidgety/restless 0   Suicidal thoughts 0   PHQ-9 Score 5   Difficult doing work/chores Not difficult at all      Psychosocial Evaluation and Intervention:     Psychosocial Evaluation - 03/31/15 0954    Psychosocial Evaluation & Interventions   Interventions Stress management education;Relaxation education;Encouraged to exercise with the program and follow exercise prescription   Comments Counselor met with Mr. WiMitzeloday.  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  He has a strong support system with a spouse of [id number] and an adult son and his significant other who live in the home with them.  He reports sleeping better lately and his appetite is good.  He also states he is typically positive, although he has had some post surgery depressive symtpoms that are making it difficult for  him to quit smoking as a coping mechanism.  Counselor recommended he speak with his doctor about possible medication for these depressive symptoms to help treat this as well as decrease the need to smoke.  Mr. 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     Continued Psychosocial Services Needed Yes  Mr. Goodwyn will benefit from talking with his doctor about medication for mood symptoms  as well as participating in the psychoeducational components of this program.        Psychosocial Re-Evaluation:     Psychosocial Re-Evaluation      04/08/15 0924 07/07/15 0847         Psychosocial Re-Evaluation   Interventions Encouraged to attend Cardiac Rehabilitation for the exercise       Comments  [name] said he is feeling calmer after he asked his MD for something to make him calmer since he  reported he was anxious that he may have another heart event.          Vocational Rehabilitation: Provide vocational rehab assistance to qualifying candidates.   Vocational Rehab Evaluation & Intervention:     Vocational Rehab - 03/16/15 1350    Initial Vocational Rehab Evaluation & Intervention   Assessment shows need for Vocational Rehabilitation No      Education: Education Goals: Education classes will be provided on a weekly basis, covering required topics. Participant will state understanding/return demonstration of topics presented.  Learning Barriers/Preferences:     Learning Barriers/Preferences - 03/16/15 1350    Learning Barriers/Preferences   Learning Barriers None   Learning Preferences None      Education Topics: General Nutrition Guidelines/Fats and Fiber: -Group instruction provided by verbal, written material, models and posters to present the general guidelines for heart healthy nutrition. Gives an explanation and review of dietary fats and fiber.          Cardiac Rehab from 07/09/2015 in [facility] Cardiac and Pulmonary Rehab   Date  07/07/15   Educator  Karolee Stamps, RD   Instruction Review Code  2- meets goals/outcomes      Controlling Sodium/Reading Food Labels: -Group verbal and written material supporting the discussion of sodium use in heart healthy nutrition. Review and explanation with models, verbal and written materials for utilization of the food label.   Exercise Physiology & Risk Factors: - Group verbal and written instruction with models to review the exercise physiology of the cardiovascular system and associated critical values. Details cardiovascular disease risk factors and the goals associated with each risk factor.      Cardiac Rehab from 07/09/2015 in [facility] Cardiac and Pulmonary Rehab   Date  06/04/15   Educator  SW   Instruction Review Code  2- meets goals/outcomes      Aerobic Exercise & Resistance Training: - Gives group verbal  and written discussion on the health impact of inactivity. On the components of aerobic and resistive training programs and the benefits of this training and how to safely progress through these programs.      Cardiac Rehab from 07/09/2015 in [facility] Cardiac and Pulmonary Rehab   Date  04/07/15   Educator  RM   Instruction Review Code  2- meets goals/outcomes      Flexibility, Balance, General Exercise Guidelines: - Provides group verbal and written instruction on the benefits of flexibility and balance training programs. Provides general exercise guidelines with specific guidelines to  those with heart or lung disease. Demonstration and skill practice provided.      Cardiac Rehab from 07/09/2015 in [facility] Cardiac and Pulmonary Rehab   Date  04/14/15   Educator  RM   Instruction Review Code  2- meets goals/outcomes      Stress Management: - Provides group verbal and written instruction about the health risks of elevated stress, cause of high stress, and healthy ways to reduce stress.      Cardiac Rehab from 07/09/2015 in [facility] Cardiac and Pulmonary Rehab   Date  06/16/15   Educator  C.Enterkin, RN   Instruction Review Code  2- meets goals/outcomes      Depression: - Provides group verbal and written instruction on the correlation between heart/lung disease and depressed mood, treatment options, and the stigmas associated with seeking treatment.      Cardiac Rehab from 07/09/2015 in [facility] Cardiac and Pulmonary Rehab   Date  05/14/15   Educator  C. Enterkin. RN   Instruction Review Code  2- meets goals/outcomes      Anatomy & Physiology of the Heart: - Group verbal and written instruction and models provide basic cardiac anatomy and physiology, with the coronary electrical and arterial systems. Review of: AMI, Angina, Valve disease, Heart Failure, Cardiac Arrhythmia, Pacemakers, and the ICD.      Cardiac Rehab from 07/09/2015 in [facility] Cardiac and Pulmonary Rehab   Date  06/18/15   Educator  CE    Instruction Review Code  2- meets goals/outcomes      Cardiac Procedures: - Group verbal and written instruction and models to describe the testing methods done to diagnose heart disease. Reviews the outcomes of the test results. Describes the treatment choices: Medical Management, Angioplasty, or Coronary Bypass Surgery.      Cardiac Rehab from 07/09/2015 in [facility] Cardiac and Pulmonary Rehab   Date  03/24/15   Educator  DW   Instruction Review Code  2- meets goals/outcomes      Cardiac Medications: - Group verbal and written instruction to review commonly prescribed medications for heart disease. Reviews the medication, class of the drug, and side effects. Includes the steps to properly store meds and maintain the prescription regimen.      Cardiac Rehab from 07/09/2015 in [facility] Cardiac and Pulmonary Rehab   Date  07/09/15   Educator  dw   Instruction Review Code  2- meets goals/outcomes      Go Sex-Intimacy & Heart Disease, Get SMART - Goal Setting: - Group verbal and written instruction through game format to discuss heart disease and the return to sexual intimacy. Provides group verbal and written material to discuss and apply goal setting through the application of the S.M.A.R.T. Method.      Cardiac Rehab from 07/09/2015 in [facility] Cardiac and Pulmonary Rehab   Date  03/24/15   Educator  DW   Instruction Review Code  2- meets goals/outcomes      Other Matters of the Heart: - Provides group verbal, written materials and models to describe Heart Failure, Angina, Valve Disease, and Diabetes in the realm of heart disease. Includes description of the disease process and treatment options available to the cardiac patient.      Cardiac Rehab from 07/09/2015 in [facility] Cardiac and Pulmonary Rehab   Date  06/30/15   Educator  DW   Instruction Review Code  2- meets goals/outcomes      Exercise & Equipment Safety: - Individual verbal instruction and demonstration of equipment use and  safety with  use of the equipment.      Cardiac Rehab from 07/09/2015 in [facility] Cardiac and Pulmonary Rehab   Date  03/16/15   Educator  Sb   Instruction Review Code  2- meets goals/outcomes      Infection Prevention: - Provides verbal and written material to individual with discussion of infection control including proper hand washing and proper equipment cleaning during exercise session.      Cardiac Rehab from 07/09/2015 in [facility] Cardiac and Pulmonary Rehab   Date  03/16/15   Educator  SB   Instruction Review Code  2- meets goals/outcomes      Falls Prevention: - Provides verbal and written material to individual with discussion of falls prevention and safety.      Cardiac Rehab from 07/09/2015 in [facility] Cardiac and Pulmonary Rehab   Date  03/16/15   Educator  sb   Instruction Review Code  2- meets goals/outcomes      Diabetes: - Individual verbal and written instruction to review signs/symptoms of diabetes, desired ranges of glucose level fasting, after meals and with exercise. Advice that pre and post exercise glucose checks will be done for 3 sessions at entry of program.    Knowledge Questionnaire Score:   Personal Goals and Risk Factors at Admission:     Personal Goals and Risk Factors at Admission - 03/16/15 1353    Personal Goals and Risk Factors on Admission    Weight Management Yes   Intervention Learn and follow the exercise and diet guidelines while in the program. Utilize the nutrition and education classes to help gain knowledge of the diet and exercise expectations in the program  HAs been working on weight loss for diabetes prevention.   Admit Weight 183 lb (83.008 kg)   Goal Weight 185 lb (83.915 kg)   Increase Aerobic Exercise and Physical Activity Yes  Is walking 4 days a week at least 30 min.   Intervention While in program, learn and follow the exercise prescription taught. Start at a low level workload and increase workload after able to maintain previous level for 30  minutes. Increase time before increasing intensity.   Take Less Medication Yes   Intervention Learn your risk factors and begin the lifestyle modifications for risk factor control during your time in the program.   Understand more about Heart/Pulmonary Disease. Yes   Intervention While in program utilize professionals for any questions, and attend the education sessions. Great websites to use are www.americanheart.org or www.lung.org for reliable information.   Diabetes No   Hypertension Yes   Goal Participant will see blood pressure controlled within the values of 140/44m/Hg or within value directed by their physician.   Intervention Provide nutrition & aerobic exercise along with prescribed medications to achieve BP 140/90 or less.   Lipids Yes   Goal Cholesterol controlled with medications as prescribed, with individualized exercise RX and with personalized nutrition plan. Value goals: LDL < 738m HDL > 4023mParticipant states understanding of desired cholesterol values and following prescriptions.   Intervention Provide nutrition & aerobic exercise along with prescribed medications to achieve LDL <35m22mDL >40mg101mStress No      Personal Goals and Risk Factors Review:      Goals and Risk Factor Review      04/08/15 0923 04/28/15 1111 05/27/15 0904 06/18/15 1016     Weight Management   Goals Progress/Improvement seen  Yes  Yes    Comments  Weight 184 today.  Patient feels the best when his weight is between 183 - 188.  He tries to keep his weight less than 190, which it is today, as he feels the best when it is less than 190 pounds.    Doing well with maintaining weight.    Increase Aerobic Exercise and Physical Activity   Goals Progress/Improvement seen  Yes Yes Yes Yes    Comments [name] has increased his Cardiac Rehab workloads.  In addition to coming to Cardiac Rehab on Tuesday and Thursday mornings, [name] walks every 3/4th mile every morning.  [name] stated his anxiety has improved  with exercise, as he is finding he does not need his anti-anxiety meds as often when he exercises.  Patient stated he is also sleeping better.   [name] continues to walk at home and is doing very well in the program. He has made increases on all of the equipment and he can continuously exercise for the entire class time. We will continue to progress him by increasing his intensity. He has been encouraged to continue with his home exercise and we will follow up with him each month to ensure he continues to accumulate exercise at home.  Feels much better than at the beginning of the program, looking very forward to playing golf hopefully in March, as the weather warms.  Also going to try to maintain exercise regimen 2-3x a week    Hypertension   Goal  --  Patient's BP the last 3 sessions has been 98 - 595 Systolic and 60 - 80  diastolic.    Participant will see blood pressure controlled within the values of 140/43m/Hg or within value directed by their physician.    Abnormal Lipids   Goal  Cholesterol controlled with medications as prescribed, with individualized exercise RX and with personalized nutrition plan. Value goals: LDL < 756m HDL > 4088mParticipant states understanding of desired cholesterol values and following prescriptions.  Cholesterol medication as prescribed.  No new lab values at this time.  Continue medication for cholesterol as prescribed, diet, and exercise plan.           Personal Goals Discharge (Final Personal Goals and Risk Factors Review):      Goals and Risk Factor Review - 06/18/15 1016    Weight Management   Goals Progress/Improvement seen Yes   Comments Doing well with maintaining weight.   Increase Aerobic Exercise and Physical Activity   Goals Progress/Improvement seen  Yes   Comments Feels much better than at the beginning of the program, looking very forward to playing golf hopefully in March, as the weather warms.  Also going to try to maintain exercise regimen 2-3x  a week   Hypertension   Goal Participant will see blood pressure controlled within the values of 140/33m22m or within value directed by their physician.      ITP Comments:     ITP Comments      05/05/15 1349 06/25/15 1313 07/07/15 0847 07/21/15 0901 07/21/15 0902   ITP Comments 30 day review preparation  Continue with ITP Ready for 30 day review. Continue with ITP. only 4 visits since last review DougMarden Nobleuld meet his goals by visit 36. 930 Day Review.  Continue with ITP. last visit 07/09/2015      Comments:

## 2015-07-28 ENCOUNTER — Encounter: Payer: 59 | Admitting: *Deleted

## 2015-07-28 DIAGNOSIS — Z951 Presence of aortocoronary bypass graft: Secondary | ICD-10-CM

## 2015-07-28 NOTE — Progress Notes (Signed)
Daily Session Note  Patient Details  Name: Tommy [name] MRN: [id number] Date of Birth: May 02, 1951 Referring Provider:  Gustavo Lah, MD  Encounter Date: 07/28/2015  Check In:     Session Check In - 07/28/15 0907    Check-In   Location [facility]   Staff Present Nyoka Cowden, RN;Rebecca Sickles, PT;Diane Joya Gaskins, RN, BSN   Supervising physician immediately available to respond to emergencies See telemetry face sheet for immediately available ER MD   Medication changes reported     No   Fall or balance concerns reported    No   Warm-up and Cool-down Performed on first and last piece of equipment   Resistance Training Performed Yes   VAD Patient? No   Pain Assessment   Currently in Pain? No/denies         Goals Met:  Independence with exercise equipment Exercise tolerated well No report of cardiac concerns or symptoms Strength training completed today  Goals Unmet:  Not Applicable  Comments:  Patient completed exercise prescription and all exercise goals during rehab session. The exercise was tolerated well and the patient is progressing in the program.    Dr. Emily Filbert is Medical Director for [facility].

## 2015-07-29 NOTE — Addendum Note (Signed)
Addended by: Rudy Jew on: 07/29/2015 10:52 AM   Modules accepted: Orders

## 2015-07-30 ENCOUNTER — Ambulatory Visit: Payer: 59

## 2015-08-04 ENCOUNTER — Ambulatory Visit: Payer: 59

## 2015-08-06 ENCOUNTER — Ambulatory Visit: Payer: 59

## 2015-08-11 ENCOUNTER — Ambulatory Visit: Payer: 59

## 2015-08-13 ENCOUNTER — Ambulatory Visit: Payer: 59

## 2015-08-14 ENCOUNTER — Telehealth: Payer: Self-pay | Admitting: *Deleted

## 2015-08-14 ENCOUNTER — Encounter: Payer: Self-pay | Admitting: *Deleted

## 2015-08-14 NOTE — Progress Notes (Signed)
Cardiac Individual Treatment Plan  Patient Details  Name: Tommy [name] MRN: [id number] Date of Birth: 18-Dec-1950 Referring Provider:  No ref. provider found  Initial Encounter Date:       Cardiac Rehab from 03/16/2015 in [facility] Cardiac and Pulmonary Rehab   Date  03/16/15      Visit Diagnosis: No diagnosis found.  Patient's Home Medications on Admission:  Current outpatient prescriptions:  .  aspirin 81 MG chewable tablet, Chew 81 mg by mouth., Disp: , Rfl:  .  atorvastatin (LIPITOR) 80 MG tablet, Take 80 mg by mouth., Disp: , Rfl:  .  Glucosamine Sulfate 500 MG TABS, Take by mouth., Disp: , Rfl:  .  metoprolol tartrate (LOPRESSOR) 25 MG tablet, Take 25 mg by mouth., Disp: , Rfl:  .  Multiple Vitamin (MULTI-VITAMINS) TABS, Take by mouth., Disp: , Rfl:  .  nicotine (RA NICOTINE) 7 mg/24hr patch, Place onto the skin., Disp: , Rfl:  .  Omega-3 1000 MG CAPS, Take by mouth., Disp: , Rfl:  .  oxyCODONE (OXY IR/ROXICODONE) 5 MG immediate release tablet, Take 5 mg by mouth., Disp: , Rfl:   Past Medical History: No past medical history on file.  Tobacco Use: History  Smoking status  . Former Smoker  . Quit date: 02/23/2015  Smokeless tobacco  . Never Used    Labs: Recent Review Flowsheet Data    Labs for ITP Cardiac and Pulmonary Rehab Latest Ref Rng 08/22/2011 08/23/2011   Cholestrol 0-200 mg/dL - 184   LDLCALC 0-100 mg/dL - 124(H)   HDL 40-60 mg/dL - 38(L)   Trlycerides 0-200 mg/dL - 110   Hemoglobin A1c 4.2-6.3 % 7.1(H) -       Exercise Target Goals:    Exercise Program Goal: Individual exercise prescription set with THRR, safety & activity barriers. Participant demonstrates ability to understand and report RPE using BORG scale, to self-measure pulse accurately, and to acknowledge the importance of the exercise prescription.  Exercise Prescription Goal: Starting with aerobic activity 30 plus minutes a day, 3 days per week for initial exercise prescription.  Provide home exercise prescription and guidelines that participant acknowledges understanding prior to discharge.  Activity Barriers & Risk Stratification:     Activity Barriers & Cardiac Risk Stratification - 03/16/15 1347    Activity Barriers & Cardiac Risk Stratification   Activity Barriers Back Problems      6 Minute Walk:     6 Minute Walk      03/16/15 1412       6 Minute Walk   Phase Initial     Distance 1590 feet     Walk Time 6 minutes     RPE 11     Symptoms No     Resting HR 83 bpm     Resting BP 126/74 mmHg     Max Ex. HR 102 bpm     Max Ex. BP 138/70 mmHg        Initial Exercise Prescription:     Initial Exercise Prescription - 03/16/15 1400    Date of Initial Exercise Prescription   Date 03/16/15   Treadmill   MPH 3   Grade 0   Minutes 15   Bike   Level 1.4   Minutes 15   Recumbant Bike   Level 3   Watts 30   Minutes 15   NuStep   Level 4   Watts 50   Minutes 15   Arm Ergometer   Level 1  Watts 10   Minutes 10   Arm/Foot Ergometer   Level 1   Watts 15   Minutes 10   Cybex   Level 3   RPM 30   Minutes 15   Recumbant Elliptical   Level 2   Watts 25   Minutes 15   Elliptical   Level 1   Speed 3   Minutes 5   REL-XR   Level 3   Watts 30   Minutes 15   Prescription Details   Frequency (times per week) 3   Duration Progress to 30 minutes of continuous aerobic without signs/symptoms of physical distress   Intensity   THRR REST +  30   Ratings of Perceived Exertion 11-15   Progression   Progression Continue progressive overload as per policy without signs/symptoms or physical distress.   Resistance Training   Training Prescription Yes  ROM only if needed due to muscle soreness   Weight 1   Reps 10-12      Perform Capillary Blood Glucose checks as needed.  Exercise Prescription Changes:     Exercise Prescription Changes      03/24/15 0900 03/31/15 0900 04/07/15 1400 04/14/15 1634 04/28/15 1000   Exercise Review    Progression Yes Yes No No No   Response to Exercise   Blood Pressure (Admit)   118/76 mmHg 118/80 mmHg    Blood Pressure (Exercise)   132/72 mmHg 140/72 mmHg    Blood Pressure (Exit)   102/64 mmHg 98/60 mmHg    Heart Rate (Admit)   109 bpm 89 bpm    Heart Rate (Exercise)   123 bpm 128 bpm    Heart Rate (Exit)   87 bpm 96 bpm    Rating of Perceived Exertion (Exercise)   12 12    Symptoms  no no no None   Comments [name] did well his first of class with no signs or symptoms. I increased him to level 4 in the XR and he was able to complete that with no concerns.  I spoke with [name] about his increasing his speed on the treadmill and level on the Nustep. He completed those increases today in class without signs or symptoms.    Reviewed individualized exercise prescription and made increases per departmental policy. Exercise increases were discussed with the patient and they were able to perform the new work loads without issue (no signs or symptoms).    Duration   Progress to 30 minutes of continuous aerobic without signs/symptoms of physical distress Progress to 30 minutes of continuous aerobic without signs/symptoms of physical distress Progress to 30 minutes of continuous aerobic without signs/symptoms of physical distress   Intensity   Rest + 30 Rest + 30 Rest + 30   Progression   Progression   Continue progressive overload as per policy without signs/symptoms or physical distress. Continue progressive overload as per policy without signs/symptoms or physical distress. Continue progressive overload as per policy without signs/symptoms or physical distress.   Resistance Training   Training Prescription (read-only)  Yes Yes Yes Yes   Weight (read-only)  3 3 3 3    Reps (read-only)  10-15 10-15 10-15 10-15   Interval Training   Interval Training  No No No No   Treadmill   MPH (read-only)  3.2 3.2 3.2 3.2   Grade (read-only)  0 0 0 1   Minutes (read-only)  15 15 15 20    NuStep   Level  (read-only)  2 2 2  3   Watts (read-only)  30 30 30  40   Minutes (read-only)  15 15 15 20    REL-XR   Level (read-only) 5 5 5 5 5    Watts (read-only) 55 55 55 55 55   Minutes (read-only) 15 15 15 15 15      05/05/15 0900 05/14/15 0900 05/21/15 1000 05/21/15 1454 06/11/15 0713   Exercise Review   Progression No Yes Yes Yes    Response to Exercise   Blood Pressure (Admit)    116/64 mmHg 114/62 mmHg   Blood Pressure (Exercise)    128/76 mmHg 142/82 mmHg   Blood Pressure (Exit)    116/70 mmHg 106/62 mmHg   Heart Rate (Admit)    76 bpm 96 bpm   Heart Rate (Exercise)    110 bpm 120 bpm   Heart Rate (Exit)    85 bpm 78 bpm   Rating of Perceived Exertion (Exercise)    12 12   Symptoms None None None None None   Comments [name] will be traveling for the next week. I reviewed with him things he can do while he is traveling in order to maintain his exercise progress. He plans on doing a lot of walking and staying active while he is away. [name] will be traveling for the next week. I reviewed with him things he can do while he is traveling in order to maintain his exercise progress. He plans on doing a lot of walking and staying active while he is away. Patient started interval training. Interval training guidelines were discussed with the patient. He tolerated these increases with no signs or symptoms.      Duration Progress to 30 minutes of continuous aerobic without signs/symptoms of physical distress Progress to 30 minutes of continuous aerobic without signs/symptoms of physical distress Progress to 30 minutes of continuous aerobic without signs/symptoms of physical distress Progress to 30 minutes of continuous aerobic without signs/symptoms of physical distress Progress to 30 minutes of continuous aerobic without signs/symptoms of physical distress   Intensity Rest + 30 Rest + 30 Rest + 30 Rest + 30 Rest + 30   Progression   Progression Continue progressive overload as per policy without signs/symptoms or  physical distress. Continue progressive overload as per policy without signs/symptoms or physical distress. Continue progressive overload as per policy without signs/symptoms or physical distress. Continue progressive overload as per policy without signs/symptoms or physical distress. Continue progressive overload as per policy without signs/symptoms or physical distress.   Resistance Training   Training Prescription (read-only) Yes Yes Yes Yes Yes   Weight (read-only) 3 3 3 3 3    Reps (read-only) 10-15 10-15 10-15 10-15 10-15   Interval Training   Interval Training No No Yes Yes Yes   Equipment   Treadmill Treadmill Treadmill   Comments   3.2/ 2-4% 3.2/ 2-4% 3.2/ 2-4%   Treadmill   MPH (read-only) 3.2 3.2 3.2 3.2 3.2   Grade (read-only) 1 2 4   Interval training from 2-4% 4  Interval training from 2-4% 4  Interval training from 2-4%   Minutes (read-only) 20 20 20 20 20    NuStep   Level (read-only) 3 3 3 3 3    Watts (read-only) 40 40 40 40 40   Minutes (read-only) 20 20 20 20 20    REL-XR   Level (read-only) 5 5 5 5 5    Watts (read-only) 55 55 55 55 55   Minutes (read-only) 15 15 15 15 15    T5 Nustep  Level (read-only)  4 4 4 4    Watts (read-only)  40 40 40 40   Minutes (read-only)  15 15 15 15      06/16/15 0709 06/23/15 0900 07/07/15 0900 07/09/15 0900 07/28/15 0938   Exercise Review   Progression  Yes Yes Yes --   Response to Exercise   Blood Pressure (Admit) 114/62 mmHg   120/70 mmHg 122/74 mmHg   Blood Pressure (Exercise) 142/82 mmHg   166/82 mmHg 132/64 mmHg   Blood Pressure (Exit) 106/62 mmHg   104/68 mmHg 110/70 mmHg   Heart Rate (Admit) 96 bpm   82 bpm 97 bpm   Heart Rate (Exercise) 120 bpm   132 bpm 106 bpm   Heart Rate (Exit) 78 bpm   94 bpm 94 bpm   Rating of Perceived Exertion (Exercise) 12   12 12    Symptoms None None None None --   Comments  Adjusted [name]'s interval training to make it challenging. [name] was able to perform it with no problems and appreciates the  individualized program. Adjusted [name]'s interval training to make it challenging. [name] was able to perform it with no problems and appreciates the individualized program. [name] WAS ASKED TO ADD 2 [place] has not attended Heart Track since 2/28.  He will be contacted to determine barriers to attending and completing program.   Frequency    Add 2 additional days to program exercise sessions. --   Duration Progress to 30 minutes of continuous aerobic without signs/symptoms of physical distress Progress to 30 minutes of continuous aerobic without signs/symptoms of physical distress Progress to 30 minutes of continuous aerobic without signs/symptoms of physical distress Progress to 30 minutes of continuous aerobic without signs/symptoms of physical distress --   Intensity Rest + 30 Rest + 30 Rest + 30 Rest + 30 --   Progression   Progression Continue progressive overload as per policy without signs/symptoms or physical distress. Continue progressive overload as per policy without signs/symptoms or physical distress. Continue progressive overload as per policy without signs/symptoms or physical distress. Continue progressive overload as per policy without signs/symptoms or physical distress. --   Horticulturist, commercial Prescription (read-only) Yes Yes Yes Yes    Weight (read-only) 3 3 3 3     Reps (read-only) 10-15 10-15 10-15 10-15    Interval Training   Interval Training Yes Yes Yes Yes Yes   Equipment Treadmill Treadmill Treadmill Treadmill Treadmill   Comments 3.2/ 2-4% 3.4/ 2-5% 3.4/ 2-5% 3.4/ 2-5% 3.4/ 2-5%   Treadmill   MPH (read-only) 3.2 3.2 3.2 3.5    Grade (read-only) 4  Interval training from 2-4% 4  Interval training from 2-4% 4  Interval training from 2-4% 5  Interval training from 2-5%    Minutes (read-only) 20 20 20 20     NuStep   Level (read-only) 3 3 3 3     Watts (read-only) 40 40 40 40    Minutes (read-only) 20 20 20 20     REL-XR    Level (read-only) 5 5 5 5     Watts (read-only) 55 55 55 55    Minutes (read-only) 15 15 15 15     T5 Nustep   Level (read-only) 4 4 4 5     Watts (read-only) 40 40 40 50    Minutes (read-only) 15 15 15 15        Exercise Comments:   Discharge Exercise Prescription (Final Exercise Prescription Changes):  Exercise Prescription Changes - 07/28/15 0938    Exercise Review   Progression --   Response to Exercise   Blood Pressure (Admit) 122/74 mmHg   Blood Pressure (Exercise) 132/64 mmHg   Blood Pressure (Exit) 110/70 mmHg   Heart Rate (Admit) 97 bpm   Heart Rate (Exercise) 106 bpm   Heart Rate (Exit) 94 bpm   Rating of Perceived Exertion (Exercise) 12   Symptoms --   Comments [name] has not attended Heart Track since 2/28.  He will be contacted to determine barriers to attending and completing program.   Frequency --   Duration --   Intensity --   Progression   Progression --   Interval Training   Interval Training Yes   Equipment Treadmill   Comments 3.4/ 2-5%      Nutrition:  Target Goals: Understanding of nutrition guidelines, daily intake of sodium <1563m, cholesterol <2092m calories 30% from fat and 7% or less from saturated fats, daily to have 5 or more servings of fruits and vegetables.  Biometrics:     Pre Biometrics - 03/16/15 1419    Pre Biometrics   Height 5' 10.25" (1.784 m)   Weight 183 lb 1.6 oz (83.054 kg)   Waist Circumference 37 inches   Hip Circumference 40 inches   Waist to Hip Ratio 0.92 %   BMI (Calculated) 26.1       Nutrition Therapy Plan and Nutrition Goals:   Nutrition Discharge: Rate Your Plate Scores:   Nutrition Goals Re-Evaluation:     Nutrition Goals Re-Evaluation      04/08/15 0923           Personal Goal #1 Re-Evaluation   Personal Goal #1 [name] eats healthy. Has a good BMI also.        Goal Progress Seen Yes          Psychosocial: Target Goals: Acknowledge presence or absence of depression, maximize coping  skills, provide positive support system. Participant is able to verbalize types and ability to use techniques and skills needed for reducing stress and depression.  Initial Review & Psychosocial Screening:     Initial Psych Review & Screening - 03/16/15 1444    Screening Interventions   Interventions Encouraged to exercise      Quality of Life Scores:   PHQ-9:     Recent Review Flowsheet Data    [facility]   Decreased Interest 0   Down, Depressed, Hopeless 1   PHQ - 2 Score 1   Altered sleeping 1   Tired, decreased energy 1   Change in appetite 1   Feeling bad or failure about yourself  1   Trouble concentrating 0   Moving slowly or fidgety/restless 0   Suicidal thoughts 0   PHQ-9 Score 5   Difficult doing work/chores Not difficult at all      Psychosocial Evaluation and Intervention:     Psychosocial Evaluation - 03/31/15 0954    Psychosocial Evaluation & Interventions   Interventions Stress management education;Relaxation education;Encouraged to exercise with the program and follow exercise prescription   Comments Counselor met with Mr. WiSenftoday.  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  He has a strong support system with a spouse of [id number] and an adult son and his significant other who live in the home with them.  He reports sleeping better lately and his appetite is good.  He also states he is typically positive,  although he has had some post surgery depressive symtpoms that are making it difficult for him to quit smoking as a coping mechanism.  Counselor recommended he speak with his doctor about possible medication for these depressive symptoms to help treat this as well as decrease the need to smoke.  Mr. 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     Continued Psychosocial Services Needed Yes  Mr. Blunck  will benefit from talking with his doctor about medication for mood symptoms  as well as participating in the psychoeducational components of this program.        Psychosocial Re-Evaluation:     Psychosocial Re-Evaluation      04/08/15 0924 07/07/15 0847 08/14/15 1015       Psychosocial Re-Evaluation   Interventions Encouraged to attend Cardiac Rehabilitation for the exercise  Encouraged to attend Cardiac Rehabilitation for the exercise     Comments  [name] said he is feeling calmer after he asked his MD for something to make him calmer since he reported he was anxious that he may have another heart event.  Left vm to check on [name]-"your neighbor said you got a large bill ($1500) from Cardiac Rehab. I wanted to touch base with you. Maybe it was since the first of year you had a deductable.'. Neighbor said [name] probably won't be returning due to the large bill. Last visit was 07/28/2015        Vocational Rehabilitation: Provide vocational rehab assistance to qualifying candidates.   Vocational Rehab Evaluation & Intervention:     Vocational Rehab - 03/16/15 1350    Initial Vocational Rehab Evaluation & Intervention   Assessment shows need for Vocational Rehabilitation No      Education: Education Goals: Education classes will be provided on a weekly basis, covering required topics. Participant will state understanding/return demonstration of topics presented.  Learning Barriers/Preferences:     Learning Barriers/Preferences - 03/16/15 1350    Learning Barriers/Preferences   Learning Barriers None   Learning Preferences None      Education Topics: General Nutrition Guidelines/Fats and Fiber: -Group instruction provided by verbal, written material, models and posters to present the general guidelines for heart healthy nutrition. Gives an explanation and review of dietary fats and fiber.          Cardiac Rehab from 07/28/2015 in [facility] Cardiac and Pulmonary Rehab   Date   07/07/15   Educator  Karolee Stamps, RD   Instruction Review Code  2- meets goals/outcomes      Controlling Sodium/Reading Food Labels: -Group verbal and written material supporting the discussion of sodium use in heart healthy nutrition. Review and explanation with models, verbal and written materials for utilization of the food label.   Exercise Physiology & Risk Factors: - Group verbal and written instruction with models to review the exercise physiology of the cardiovascular system and associated critical values. Details cardiovascular disease risk factors and the goals associated with each risk factor.      Cardiac Rehab from 07/28/2015 in [facility] Cardiac and Pulmonary Rehab   Date  07/28/15   Educator  BS   Instruction Review Code  2- meets goals/outcomes      Aerobic Exercise & Resistance Training: - Gives group verbal and written discussion on the health impact of inactivity. On the components of aerobic and resistive training programs and the benefits of this training and how to  safely progress through these programs.      Cardiac Rehab from 07/28/2015 in [facility] Cardiac and Pulmonary Rehab   Date  04/07/15   Educator  RM   Instruction Review Code  2- meets goals/outcomes      Flexibility, Balance, General Exercise Guidelines: - Provides group verbal and written instruction on the benefits of flexibility and balance training programs. Provides general exercise guidelines with specific guidelines to those with heart or lung disease. Demonstration and skill practice provided.      Cardiac Rehab from 07/28/2015 in [facility] Cardiac and Pulmonary Rehab   Date  04/14/15   Educator  RM   Instruction Review Code  2- meets goals/outcomes      Stress Management: - Provides group verbal and written instruction about the health risks of elevated stress, cause of high stress, and healthy ways to reduce stress.      Cardiac Rehab from 07/28/2015 in [facility] Cardiac and Pulmonary Rehab   Date   06/16/15   Educator  C.Alannis Hsia, RN   Instruction Review Code  2- meets goals/outcomes      Depression: - Provides group verbal and written instruction on the correlation between heart/lung disease and depressed mood, treatment options, and the stigmas associated with seeking treatment.      Cardiac Rehab from 07/28/2015 in [facility] Cardiac and Pulmonary Rehab   Date  05/14/15   Educator  C. Valerian Jewel. RN   Instruction Review Code  2- meets goals/outcomes      Anatomy & Physiology of the Heart: - Group verbal and written instruction and models provide basic cardiac anatomy and physiology, with the coronary electrical and arterial systems. Review of: AMI, Angina, Valve disease, Heart Failure, Cardiac Arrhythmia, Pacemakers, and the ICD.      Cardiac Rehab from 07/28/2015 in [facility] Cardiac and Pulmonary Rehab   Date  06/18/15   Educator  CE   Instruction Review Code  2- meets goals/outcomes      Cardiac Procedures: - Group verbal and written instruction and models to describe the testing methods done to diagnose heart disease. Reviews the outcomes of the test results. Describes the treatment choices: Medical Management, Angioplasty, or Coronary Bypass Surgery.      Cardiac Rehab from 07/28/2015 in [facility] Cardiac and Pulmonary Rehab   Date  03/24/15   Educator  DW   Instruction Review Code  2- meets goals/outcomes      Cardiac Medications: - Group verbal and written instruction to review commonly prescribed medications for heart disease. Reviews the medication, class of the drug, and side effects. Includes the steps to properly store meds and maintain the prescription regimen.      Cardiac Rehab from 07/28/2015 in [facility] Cardiac and Pulmonary Rehab   Date  07/09/15   Educator  dw   Instruction Review Code  2- meets goals/outcomes      Go Sex-Intimacy & Heart Disease, Get SMART - Goal Setting: - Group verbal and written instruction through game format to discuss heart disease and the return  to sexual intimacy. Provides group verbal and written material to discuss and apply goal setting through the application of the S.M.A.R.T. Method.      Cardiac Rehab from 07/28/2015 in [facility] Cardiac and Pulmonary Rehab   Date  03/24/15   Educator  DW   Instruction Review Code  2- meets goals/outcomes      Other Matters of the Heart: - Provides group verbal, written materials and models to describe Heart Failure, Angina, Valve Disease, and  Diabetes in the realm of heart disease. Includes description of the disease process and treatment options available to the cardiac patient.      Cardiac Rehab from 07/28/2015 in [facility] Cardiac and Pulmonary Rehab   Date  06/30/15   Educator  DW   Instruction Review Code  2- meets goals/outcomes      Exercise & Equipment Safety: - Individual verbal instruction and demonstration of equipment use and safety with use of the equipment.      Cardiac Rehab from 07/28/2015 in [facility] Cardiac and Pulmonary Rehab   Date  03/16/15   Educator  Sb   Instruction Review Code  2- meets goals/outcomes      Infection Prevention: - Provides verbal and written material to individual with discussion of infection control including proper hand washing and proper equipment cleaning during exercise session.      Cardiac Rehab from 07/28/2015 in [facility] Cardiac and Pulmonary Rehab   Date  03/16/15   Educator  SB   Instruction Review Code  2- meets goals/outcomes      Falls Prevention: - Provides verbal and written material to individual with discussion of falls prevention and safety.      Cardiac Rehab from 07/28/2015 in [facility] Cardiac and Pulmonary Rehab   Date  03/16/15   Educator  sb   Instruction Review Code  2- meets goals/outcomes      Diabetes: - Individual verbal and written instruction to review signs/symptoms of diabetes, desired ranges of glucose level fasting, after meals and with exercise. Advice that pre and post exercise glucose checks will be done for 3 sessions  at entry of program.    Knowledge Questionnaire Score:   Personal Goals and Risk Factors at Admission:     Personal Goals and Risk Factors at Admission - 03/16/15 1353    Core Components/Risk Factors/Patient Goals on Admission    Weight Management Yes   Intervention (read-only) Learn and follow the exercise and diet guidelines while in the program. Utilize the nutrition and education classes to help gain knowledge of the diet and exercise expectations in the program  HAs been working on weight loss for diabetes prevention.   Admit Weight 183 lb (83.008 kg)   Goal Weight: Short Term 185 lb (83.915 kg)   Sedentary Yes  Is walking 4 days a week at least 30 min.   Intervention (read-only) While in program, learn and follow the exercise prescription taught. Start at a low level workload and increase workload after able to maintain previous level for 30 minutes. Increase time before increasing intensity.   Diabetes No   Hypertension Yes   Goal Participant will see blood pressure controlled within the values of 140/48m/Hg or within value directed by their physician.   Intervention (read-only) Provide nutrition & aerobic exercise along with prescribed medications to achieve BP 140/90 or less.   Lipids Yes   Goal Cholesterol controlled with medications as prescribed, with individualized exercise RX and with personalized nutrition plan. Value goals: LDL < 726m HDL > 4069mParticipant states understanding of desired cholesterol values and following prescriptions.   Intervention (read-only) Provide nutrition & aerobic exercise along with prescribed medications to achieve LDL <43m49mDL >40mg51mStress No   Take Less Medication Yes   Intervention Learn your risk factors and begin the lifestyle modifications for risk factor control during your time in the program.   Understand more about Heart/Pulmonary Disease. Yes   Intervention While in program utilize professionals for any questions, and  attend the education sessions. Great websites to use are www.americanheart.org or www.lung.org for reliable information.      Personal Goals and Risk Factors Review:      Goals and Risk Factor Review      04/08/15 0923 04/28/15 1111 05/27/15 0904 06/18/15 1016 08/14/15 1014   Core Components/Risk Factors/Patient Goals Review   Personal Goals Review Increase Aerobic Exercise and Physical Activity Weight Management Increase Aerobic Exercise and Physical Activity  Sedentary   Review     Left vm to check on [name]-"your neighbor said you got a large bill ($1500) from [facility]. I wanted to touch base with you. Maybe it was since the first of year you had a deductable.'. Neighbor said [name] probably won't be returning due to the large bill. Last visit was 07/28/2015   Expected Outcomes     To eventually be independent with his exercise plan when he completes Card[facility] Rehab.    Weight Management (read-only)   Goals Progress/Improvement seen  Yes  Yes    Comments  Weight 184 today.  Patient feels the best when his weight is between 183 - 188.  He tries to keep his weight less than 190, which it is today, as he feels the best when it is less than 190 pounds.    Doing well with maintaining weight.    Increase Aerobic Exercise and Physical Activity (read-only)   Goals Progress/Improvement seen  Yes Yes Yes Yes    Comments [name] has increased his [facility] workloads.  In addition to coming to [facility] on Tuesday and Thursday mornings, [name] walks every 3/4th mile every morning.  [name] stated his anxiety has improved with exercise, as he is finding he does not need his anti-anxiety meds as often when he exercises.  Patient stated he is also sleeping better.   [name] continues to walk at home and is doing very well in the program. He has made increases on all of the equipment and he can continuously exercise for the entire class time. We will continue to progress him by increasing his intensity. He has been  encouraged to continue with his home exercise and we will follow up with him each month to ensure he continues to accumulate exercise at home.  Feels much better than at the beginning of the program, looking very forward to playing golf hopefully in March, as the weather warms.  Also going to try to maintain exercise regimen 2-3x a week    Hypertension (read-only)   Goal  --  Patient's BP the last 3 sessions has been 98 - 725 Systolic and 60 - 80  diastolic.    Participant will see blood pressure controlled within the values of 140/62m/Hg or within value directed by their physician.    Abnormal Lipids (read-only)   Goal  Cholesterol controlled with medications as prescribed, with individualized exercise RX and with personalized nutrition plan. Value goals: LDL < 733m HDL > 4038mParticipant states understanding of desired cholesterol values and following prescriptions.  Cholesterol medication as prescribed.  No new lab values at this time.  Continue medication for cholesterol as prescribed, diet, and exercise plan.           Personal Goals Discharge (Final Personal Goals and Risk Factors Review):      Goals and Risk Factor Review - 08/14/15 1014    Core Components/Risk Factors/Patient Goals Review   Personal Goals Review Sedentary   Review Left vm to check on [name]-"your neighbor said you got  a large bill ($1500) from [facility]. I wanted to touch base with you. Maybe it was since the first of year you had a deductable.'. Neighbor said [name] probably won't be returning due to the large bill. Last visit was 07/28/2015   Expected Outcomes To eventually be independent with his exercise plan when he completes Cardiac Rehab.       ITP Comments:     ITP Comments      05/05/15 1349 06/25/15 1313 07/07/15 0847 07/21/15 0901 07/21/15 0902   ITP Comments 30 day review preparation  Continue with ITP Ready for 30 day review. Continue with ITP. only 4 visits since last review [name] should meet his  goals by visit 68.  30 Day Review.  Continue with ITP. last visit 07/09/2015     08/14/15 1013           ITP Comments Left vm to check on [name]-"your neighbor said you got a large bill ($1500) from [facility]. I wanted to touch base with you. Maybe it was since the first of year you had a deductable.'. Neighbor said [name] probably won't be returning due to the large bill. Last visit was 07/28/2015          Comments: Left vm to check on [name]-"your neighbor said you got a large bill ($1500) from [facility]. I wanted to touch base with you. Maybe it was since the first of year you had a deductable.'. Neighbor said [name] probably won't be returning due to the large bill. Last visit was 07/28/2015

## 2015-08-14 NOTE — Telephone Encounter (Signed)
Left vm to check on Tommy [name]-"your neighbor said you got a large bill ($1500) from [facility]. I wanted to touch base with you. Maybe it was since the first of year you had a deductable.'. Neighbor said [name] probably won't be returning due to the large bill.

## 2015-08-18 ENCOUNTER — Ambulatory Visit: Payer: 59

## 2015-08-20 ENCOUNTER — Encounter: Payer: Self-pay | Admitting: *Deleted

## 2015-08-20 ENCOUNTER — Ambulatory Visit: Payer: 59

## 2015-08-20 DIAGNOSIS — Z951 Presence of aortocoronary bypass graft: Secondary | ICD-10-CM

## 2015-08-20 NOTE — Progress Notes (Signed)
Cardiac Individual Treatment Plan  Patient Details  Name: Tommy [name] MRN: [id number] Date of Birth: [birth year] Referring Provider:  Gustavo Lah, MD  Initial Encounter Date:       Cardiac Rehab from 03/16/2015 in [facility] Cardiac and Pulmonary Rehab   Date  03/16/15      Visit Diagnosis: S/P CABG x 4  Patient's Home Medications on Admission:  Current outpatient prescriptions:  .  aspirin 81 MG chewable tablet, Chew 81 mg by mouth., Disp: , Rfl:  .  atorvastatin (LIPITOR) 80 MG tablet, Take 80 mg by mouth., Disp: , Rfl:  .  Glucosamine Sulfate 500 MG TABS, Take by mouth., Disp: , Rfl:  .  metoprolol tartrate (LOPRESSOR) 25 MG tablet, Take 25 mg by mouth., Disp: , Rfl:  .  Multiple Vitamin (MULTI-VITAMINS) TABS, Take by mouth., Disp: , Rfl:  .  nicotine (RA NICOTINE) 7 mg/24hr patch, Place onto the skin., Disp: , Rfl:  .  Omega-3 1000 MG CAPS, Take by mouth., Disp: , Rfl:  .  oxyCODONE (OXY IR/ROXICODONE) 5 MG immediate release tablet, Take 5 mg by mouth., Disp: , Rfl:   Past Medical History: No past medical history on file.  Tobacco Use: History  Smoking status  . Former Smoker  . Quit date: 02/23/2015  Smokeless tobacco  . Never Used    Labs: Recent Review Flowsheet Data    Labs for ITP Cardiac and Pulmonary Rehab Latest Ref Rng 08/22/2011 08/23/2011   Cholestrol 0-200 mg/dL - 184   LDLCALC 0-100 mg/dL - 124(H)   HDL 40-60 mg/dL - 38(L)   Trlycerides 0-200 mg/dL - 110   Hemoglobin A1c 4.2-6.3 % 7.1(H) -       Exercise Target Goals:    Exercise Program Goal: Individual exercise prescription set with THRR, safety & activity barriers. Participant demonstrates ability to understand and report RPE using BORG scale, to self-measure pulse accurately, and to acknowledge the importance of the exercise prescription.  Exercise Prescription Goal: Starting with aerobic activity 30 plus minutes a day, 3 days per week for initial exercise prescription. Provide  home exercise prescription and guidelines that participant acknowledges understanding prior to discharge.  Activity Barriers & Risk Stratification:     Activity Barriers & Cardiac Risk Stratification - 03/16/15 1347    Activity Barriers & Cardiac Risk Stratification   Activity Barriers Back Problems      6 Minute Walk:     6 Minute Walk      03/16/15 1412       6 Minute Walk   Phase Initial     Distance 1590 feet     Walk Time 6 minutes     RPE 11     Symptoms No     Resting HR 83 bpm     Resting BP 126/74 mmHg     Max Ex. HR 102 bpm     Max Ex. BP 138/70 mmHg        Initial Exercise Prescription:     Initial Exercise Prescription - 03/16/15 1400    Date of Initial Exercise Prescription   Date 03/16/15   Treadmill   MPH 3   Grade 0   Minutes 15   Bike   Level 1.4   Minutes 15   Recumbant Bike   Level 3   Watts 30   Minutes 15   NuStep   Level 4   Watts 50   Minutes 15   Arm Ergometer   Level 1  Watts 10   Minutes 10   Arm/Foot Ergometer   Level 1   Watts 15   Minutes 10   Cybex   Level 3   RPM 30   Minutes 15   Recumbant Elliptical   Level 2   Watts 25   Minutes 15   Elliptical   Level 1   Speed 3   Minutes 5   REL-XR   Level 3   Watts 30   Minutes 15   Prescription Details   Frequency (times per week) 3   Duration Progress to 30 minutes of continuous aerobic without signs/symptoms of physical distress   Intensity   THRR REST +  30   Ratings of Perceived Exertion 11-15   Progression   Progression Continue progressive overload as per policy without signs/symptoms or physical distress.   Resistance Training   Training Prescription Yes  ROM only if needed due to muscle soreness   Weight 1   Reps 10-12      Perform Capillary Blood Glucose checks as needed.  Exercise Prescription Changes:     Exercise Prescription Changes      03/24/15 0900 03/31/15 0900 04/07/15 1400 04/14/15 1634 04/28/15 1000   Exercise Review    Progression Yes Yes No No No   Response to Exercise   Blood Pressure (Admit)   118/76 mmHg 118/80 mmHg    Blood Pressure (Exercise)   132/72 mmHg 140/72 mmHg    Blood Pressure (Exit)   102/64 mmHg 98/60 mmHg    Heart Rate (Admit)   109 bpm 89 bpm    Heart Rate (Exercise)   123 bpm 128 bpm    Heart Rate (Exit)   87 bpm 96 bpm    Rating of Perceived Exertion (Exercise)   12 12    Symptoms  no no no None   Comments [name] did well his first of class with no signs or symptoms. I increased him to level 4 in the XR and he was able to complete that with no concerns.  I spoke with [name] about his increasing his speed on the treadmill and level on the Nustep. He completed those increases today in class without signs or symptoms.    Reviewed individualized exercise prescription and made increases per departmental policy. Exercise increases were discussed with the patient and they were able to perform the new work loads without issue (no signs or symptoms).    Duration   Progress to 30 minutes of continuous aerobic without signs/symptoms of physical distress Progress to 30 minutes of continuous aerobic without signs/symptoms of physical distress Progress to 30 minutes of continuous aerobic without signs/symptoms of physical distress   Intensity   Rest + 30 Rest + 30 Rest + 30   Progression   Progression   Continue progressive overload as per policy without signs/symptoms or physical distress. Continue progressive overload as per policy without signs/symptoms or physical distress. Continue progressive overload as per policy without signs/symptoms or physical distress.   Resistance Training   Training Prescription (read-only)  Yes Yes Yes Yes   Weight (read-only)  3 3 3 3    Reps (read-only)  10-15 10-15 10-15 10-15   Interval Training   Interval Training  No No No No   Treadmill   MPH (read-only)  3.2 3.2 3.2 3.2   Grade (read-only)  0 0 0 1   Minutes (read-only)  15 15 15 20    NuStep   Level (read-only)   2 2 2  3   Watts (read-only)  30 30 30  40   Minutes (read-only)  15 15 15 20    REL-XR   Level (read-only) 5 5 5 5 5    Watts (read-only) 55 55 55 55 55   Minutes (read-only) 15 15 15 15 15      05/05/15 0900 05/14/15 0900 05/21/15 1000 05/21/15 1454 06/11/15 0713   Exercise Review   Progression No Yes Yes Yes    Response to Exercise   Blood Pressure (Admit)    116/64 mmHg 114/62 mmHg   Blood Pressure (Exercise)    128/76 mmHg 142/82 mmHg   Blood Pressure (Exit)    116/70 mmHg 106/62 mmHg   Heart Rate (Admit)    76 bpm 96 bpm   Heart Rate (Exercise)    110 bpm 120 bpm   Heart Rate (Exit)    85 bpm 78 bpm   Rating of Perceived Exertion (Exercise)    12 12   Symptoms None None None None None   Comments [name] will be traveling for the next week. I reviewed with him things he can do while he is traveling in order to maintain his exercise progress. He plans on doing a lot of walking and staying active while he is away. [name] will be traveling for the next week. I reviewed with him things he can do while he is traveling in order to maintain his exercise progress. He plans on doing a lot of walking and staying active while he is away. Patient started interval training. Interval training guidelines were discussed with the patient. He tolerated these increases with no signs or symptoms.      Duration Progress to 30 minutes of continuous aerobic without signs/symptoms of physical distress Progress to 30 minutes of continuous aerobic without signs/symptoms of physical distress Progress to 30 minutes of continuous aerobic without signs/symptoms of physical distress Progress to 30 minutes of continuous aerobic without signs/symptoms of physical distress Progress to 30 minutes of continuous aerobic without signs/symptoms of physical distress   Intensity Rest + 30 Rest + 30 Rest + 30 Rest + 30 Rest + 30   Progression   Progression Continue progressive overload as per policy without signs/symptoms or physical  distress. Continue progressive overload as per policy without signs/symptoms or physical distress. Continue progressive overload as per policy without signs/symptoms or physical distress. Continue progressive overload as per policy without signs/symptoms or physical distress. Continue progressive overload as per policy without signs/symptoms or physical distress.   Resistance Training   Training Prescription (read-only) Yes Yes Yes Yes Yes   Weight (read-only) 3 3 3 3 3    Reps (read-only) 10-15 10-15 10-15 10-15 10-15   Interval Training   Interval Training No No Yes Yes Yes   Equipment   Treadmill Treadmill Treadmill   Comments   3.2/ 2-4% 3.2/ 2-4% 3.2/ 2-4%   Treadmill   MPH (read-only) 3.2 3.2 3.2 3.2 3.2   Grade (read-only) 1 2 4   Interval training from 2-4% 4  Interval training from 2-4% 4  Interval training from 2-4%   Minutes (read-only) 20 20 20 20 20    NuStep   Level (read-only) 3 3 3 3 3    Watts (read-only) 40 40 40 40 40   Minutes (read-only) 20 20 20 20 20    REL-XR   Level (read-only) 5 5 5 5 5    Watts (read-only) 55 55 55 55 55   Minutes (read-only) 15 15 15 15 15    T5 Nustep  Level (read-only)  4 4 4 4    Watts (read-only)  40 40 40 40   Minutes (read-only)  15 15 15 15      06/16/15 0709 06/23/15 0900 07/07/15 0900 07/09/15 0900 07/28/15 0938   Exercise Review   Progression  Yes Yes Yes --   Response to Exercise   Blood Pressure (Admit) 114/62 mmHg   120/70 mmHg 122/74 mmHg   Blood Pressure (Exercise) 142/82 mmHg   166/82 mmHg 132/64 mmHg   Blood Pressure (Exit) 106/62 mmHg   104/68 mmHg 110/70 mmHg   Heart Rate (Admit) 96 bpm   82 bpm 97 bpm   Heart Rate (Exercise) 120 bpm   132 bpm 106 bpm   Heart Rate (Exit) 78 bpm   94 bpm 94 bpm   Rating of Perceived Exertion (Exercise) 12   12 12    Symptoms None None None None --   Comments  Adjusted [name]'s interval training to make it challenging. [name] was able to perform it with no problems and appreciates the  individualized program. Adjusted [name]'s interval training to make it challenging. [name] was able to perform it with no problems and appreciates the individualized program. [name] WAS ASKED TO ADD 2 [place] has not attended Heart Track since 2/28.  He will be contacted to determine barriers to attending and completing program.   Frequency    Add 2 additional days to program exercise sessions. --   Duration Progress to 30 minutes of continuous aerobic without signs/symptoms of physical distress Progress to 30 minutes of continuous aerobic without signs/symptoms of physical distress Progress to 30 minutes of continuous aerobic without signs/symptoms of physical distress Progress to 30 minutes of continuous aerobic without signs/symptoms of physical distress --   Intensity Rest + 30 Rest + 30 Rest + 30 Rest + 30 --   Progression   Progression Continue progressive overload as per policy without signs/symptoms or physical distress. Continue progressive overload as per policy without signs/symptoms or physical distress. Continue progressive overload as per policy without signs/symptoms or physical distress. Continue progressive overload as per policy without signs/symptoms or physical distress. --   Horticulturist, commercial Prescription (read-only) Yes Yes Yes Yes    Weight (read-only) 3 3 3 3     Reps (read-only) 10-15 10-15 10-15 10-15    Interval Training   Interval Training Yes Yes Yes Yes Yes   Equipment Treadmill Treadmill Treadmill Treadmill Treadmill   Comments 3.2/ 2-4% 3.4/ 2-5% 3.4/ 2-5% 3.4/ 2-5% 3.4/ 2-5%   Treadmill   MPH (read-only) 3.2 3.2 3.2 3.5    Grade (read-only) 4  Interval training from 2-4% 4  Interval training from 2-4% 4  Interval training from 2-4% 5  Interval training from 2-5%    Minutes (read-only) 20 20 20 20     NuStep   Level (read-only) 3 3 3 3     Watts (read-only) 40 40 40 40    Minutes (read-only) 20 20 20 20     REL-XR    Level (read-only) 5 5 5 5     Watts (read-only) 55 55 55 55    Minutes (read-only) 15 15 15 15     T5 Nustep   Level (read-only) 4 4 4 5     Watts (read-only) 40 40 40 50    Minutes (read-only) 15 15 15 15        Exercise Comments:   Discharge Exercise Prescription (Final Exercise Prescription Changes):  Exercise Prescription Changes - 07/28/15 0938    Exercise Review   Progression --   Response to Exercise   Blood Pressure (Admit) 122/74 mmHg   Blood Pressure (Exercise) 132/64 mmHg   Blood Pressure (Exit) 110/70 mmHg   Heart Rate (Admit) 97 bpm   Heart Rate (Exercise) 106 bpm   Heart Rate (Exit) 94 bpm   Rating of Perceived Exertion (Exercise) 12   Symptoms --   Comments [name] has not attended Heart Track since 2/28.  He will be contacted to determine barriers to attending and completing program.   Frequency --   Duration --   Intensity --   Progression   Progression --   Interval Training   Interval Training Yes   Equipment Treadmill   Comments 3.4/ 2-5%      Nutrition:  Target Goals: Understanding of nutrition guidelines, daily intake of sodium <1538m, cholesterol <2064m calories 30% from fat and 7% or less from saturated fats, daily to have 5 or more servings of fruits and vegetables.  Biometrics:     Pre Biometrics - 03/16/15 1419    Pre Biometrics   Height 5' 10.25" (1.784 m)   Weight 183 lb 1.6 oz (83.054 kg)   Waist Circumference 37 inches   Hip Circumference 40 inches   Waist to Hip Ratio 0.92 %   BMI (Calculated) 26.1       Nutrition Therapy Plan and Nutrition Goals:   Nutrition Discharge: Rate Your Plate Scores:   Nutrition Goals Re-Evaluation:     Nutrition Goals Re-Evaluation      04/08/15 0923 08/14/15 1015         Personal Goal #1 Re-Evaluation   Personal Goal #1 [name] eats healthy. Has a good BMI also.        Goal Progress Seen Yes Yes      Comments  DoMarden Noblelready eats healthy. Last visit 2/29/2017 due to large bill.           Psychosocial: Target Goals: Acknowledge presence or absence of depression, maximize coping skills, provide positive support system. Participant is able to verbalize types and ability to use techniques and skills needed for reducing stress and depression.  Initial Review & Psychosocial Screening:     Initial Psych Review & Screening - 03/16/15 1444    Screening Interventions   Interventions Encouraged to exercise      Quality of Life Scores:   PHQ-9:     Recent Review Flowsheet Data    [facility]   Decreased Interest 0   Down, Depressed, Hopeless 1   PHQ - 2 Score 1   Altered sleeping 1   Tired, decreased energy 1   Change in appetite 1   Feeling bad or failure about yourself  1   Trouble concentrating 0   Moving slowly or fidgety/restless 0   Suicidal thoughts 0   PHQ-9 Score 5   Difficult doing work/chores Not difficult at all      Psychosocial Evaluation and Intervention:     Psychosocial Evaluation - 03/31/15 0954    Psychosocial Evaluation & Interventions   Interventions Stress management education;Relaxation education;Encouraged to exercise with the program and follow exercise prescription   Comments Counselor met with Mr. WiShuleroday.  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  He has a strong support system with a spouse of [id number] and an adult son and his significant other who live in the home with them.  He reports sleeping better lately and his appetite is good.  He also states he is typically positive, although he has had some post surgery depressive symtpoms that are making it difficult for him to quit smoking as a coping mechanism.  Counselor recommended he speak with his doctor about possible medication for these depressive symptoms to help treat this as well as decrease the need to smoke.  Mr. Tornow has goals to quit smoking and to get strong enough to get back on the golf course in the  spring and win the club tournament!     Continued Psychosocial Services Needed Yes  Mr. Noah will benefit from talking with his doctor about medication for mood symptoms  as well as participating in the psychoeducational components of this program.        Psychosocial Re-Evaluation:     Psychosocial Re-Evaluation      04/08/15 0924 07/07/15 0847 08/14/15 1015       Psychosocial Re-Evaluation   Interventions Encouraged to attend Cardiac Rehabilitation for the exercise  Encouraged to attend Cardiac Rehabilitation for the exercise     Comments  [name] said he is feeling calmer after he asked his MD for something to make him calmer since he reported he was anxious that he may have another heart event.  Left vm to check on [name]-"your neighbor said you got a large bill ($1500) from Cardiac Rehab. I wanted to touch base with you. Maybe it was since the first of year you had a deductable.'. Neighbor said [name] probably won't be returning due to the large bill. Last visit was 07/28/2015        Vocational Rehabilitation: Provide vocational rehab assistance to qualifying candidates.   Vocational Rehab Evaluation & Intervention:     Vocational Rehab - 03/16/15 1350    Initial Vocational Rehab Evaluation & Intervention   Assessment shows need for Vocational Rehabilitation No      Education: Education Goals: Education classes will be provided on a weekly basis, covering required topics. Participant will state understanding/return demonstration of topics presented.  Learning Barriers/Preferences:     Learning Barriers/Preferences - 03/16/15 1350    Learning Barriers/Preferences   Learning Barriers None   Learning Preferences None      Education Topics: General Nutrition Guidelines/Fats and Fiber: -Group instruction provided by verbal, written material, models and posters to present the general guidelines for heart healthy nutrition. Gives an explanation and review of dietary fats and  fiber.          Cardiac Rehab from 07/28/2015 in [facility] Cardiac and Pulmonary Rehab   Date  07/07/15   Educator  Karolee Stamps, RD   Instruction Review Code  2- meets goals/outcomes      Controlling Sodium/Reading Food Labels: -Group verbal and written material supporting the discussion of sodium use in heart healthy nutrition. Review and explanation with models, verbal and written materials for utilization of the food label.   Exercise Physiology & Risk Factors: - Group verbal and written instruction with models to review the exercise physiology of the cardiovascular system and associated critical values. Details cardiovascular disease risk factors and the goals associated with each risk factor.      Cardiac Rehab from 07/28/2015 in [facility] Cardiac and Pulmonary Rehab   Date  07/28/15   Educator  BS   Instruction Review Code  2- meets goals/outcomes      Aerobic Exercise & Resistance Training: - Gives group verbal and written discussion on the health impact of inactivity.  On the components of aerobic and resistive training programs and the benefits of this training and how to safely progress through these programs.      Cardiac Rehab from 07/28/2015 in [facility] Cardiac and Pulmonary Rehab   Date  04/07/15   Educator  RM   Instruction Review Code  2- meets goals/outcomes      Flexibility, Balance, General Exercise Guidelines: - Provides group verbal and written instruction on the benefits of flexibility and balance training programs. Provides general exercise guidelines with specific guidelines to those with heart or lung disease. Demonstration and skill practice provided.      Cardiac Rehab from 07/28/2015 in [facility] Cardiac and Pulmonary Rehab   Date  04/14/15   Educator  RM   Instruction Review Code  2- meets goals/outcomes      Stress Management: - Provides group verbal and written instruction about the health risks of elevated stress, cause of high stress, and healthy ways to reduce  stress.      Cardiac Rehab from 07/28/2015 in [facility] Cardiac and Pulmonary Rehab   Date  06/16/15   Educator  C.Enterkin, RN   Instruction Review Code  2- meets goals/outcomes      Depression: - Provides group verbal and written instruction on the correlation between heart/lung disease and depressed mood, treatment options, and the stigmas associated with seeking treatment.      Cardiac Rehab from 07/28/2015 in [facility] Cardiac and Pulmonary Rehab   Date  05/14/15   Educator  C. Enterkin. RN   Instruction Review Code  2- meets goals/outcomes      Anatomy & Physiology of the Heart: - Group verbal and written instruction and models provide basic cardiac anatomy and physiology, with the coronary electrical and arterial systems. Review of: AMI, Angina, Valve disease, Heart Failure, Cardiac Arrhythmia, Pacemakers, and the ICD.      Cardiac Rehab from 07/28/2015 in [facility] Cardiac and Pulmonary Rehab   Date  06/18/15   Educator  CE   Instruction Review Code  2- meets goals/outcomes      Cardiac Procedures: - Group verbal and written instruction and models to describe the testing methods done to diagnose heart disease. Reviews the outcomes of the test results. Describes the treatment choices: Medical Management, Angioplasty, or Coronary Bypass Surgery.      Cardiac Rehab from 07/28/2015 in [facility] Cardiac and Pulmonary Rehab   Date  03/24/15   Educator  DW   Instruction Review Code  2- meets goals/outcomes      Cardiac Medications: - Group verbal and written instruction to review commonly prescribed medications for heart disease. Reviews the medication, class of the drug, and side effects. Includes the steps to properly store meds and maintain the prescription regimen.      Cardiac Rehab from 07/28/2015 in [facility] Cardiac and Pulmonary Rehab   Date  07/09/15   Educator  dw   Instruction Review Code  2- meets goals/outcomes      Go Sex-Intimacy & Heart Disease, Get SMART - Goal Setting: - Group  verbal and written instruction through game format to discuss heart disease and the return to sexual intimacy. Provides group verbal and written material to discuss and apply goal setting through the application of the S.M.A.R.T. Method.      Cardiac Rehab from 07/28/2015 in [facility] Cardiac and Pulmonary Rehab   Date  03/24/15   Educator  DW   Instruction Review Code  2- meets goals/outcomes      Other Matters of  the Heart: - Provides group verbal, written materials and models to describe Heart Failure, Angina, Valve Disease, and Diabetes in the realm of heart disease. Includes description of the disease process and treatment options available to the cardiac patient.      Cardiac Rehab from 07/28/2015 in [facility] Cardiac and Pulmonary Rehab   Date  06/30/15   Educator  DW   Instruction Review Code  2- meets goals/outcomes      Exercise & Equipment Safety: - Individual verbal instruction and demonstration of equipment use and safety with use of the equipment.      Cardiac Rehab from 07/28/2015 in [facility] Cardiac and Pulmonary Rehab   Date  03/16/15   Educator  Sb   Instruction Review Code  2- meets goals/outcomes      Infection Prevention: - Provides verbal and written material to individual with discussion of infection control including proper hand washing and proper equipment cleaning during exercise session.      Cardiac Rehab from 07/28/2015 in [facility] Cardiac and Pulmonary Rehab   Date  03/16/15   Educator  SB   Instruction Review Code  2- meets goals/outcomes      Falls Prevention: - Provides verbal and written material to individual with discussion of falls prevention and safety.      Cardiac Rehab from 07/28/2015 in [facility] Cardiac and Pulmonary Rehab   Date  03/16/15   Educator  sb   Instruction Review Code  2- meets goals/outcomes      Diabetes: - Individual verbal and written instruction to review signs/symptoms of diabetes, desired ranges of glucose level fasting, after meals and  with exercise. Advice that pre and post exercise glucose checks will be done for 3 sessions at entry of program.    Knowledge Questionnaire Score:   Personal Goals and Risk Factors at Admission:     Personal Goals and Risk Factors at Admission - 03/16/15 1353    Core Components/Risk Factors/Patient Goals on Admission    Weight Management Yes   Intervention (read-only) Learn and follow the exercise and diet guidelines while in the program. Utilize the nutrition and education classes to help gain knowledge of the diet and exercise expectations in the program  HAs been working on weight loss for diabetes prevention.   Admit Weight 183 lb (83.008 kg)   Goal Weight: Short Term 185 lb (83.915 kg)   Sedentary Yes  Is walking 4 days a week at least 30 min.   Intervention (read-only) While in program, learn and follow the exercise prescription taught. Start at a low level workload and increase workload after able to maintain previous level for 30 minutes. Increase time before increasing intensity.   Diabetes No   Hypertension Yes   Goal Participant will see blood pressure controlled within the values of 140/30m/Hg or within value directed by their physician.   Intervention (read-only) Provide nutrition & aerobic exercise along with prescribed medications to achieve BP 140/90 or less.   Lipids Yes   Goal Cholesterol controlled with medications as prescribed, with individualized exercise RX and with personalized nutrition plan. Value goals: LDL < 716m HDL > 4067mParticipant states understanding of desired cholesterol values and following prescriptions.   Intervention (read-only) Provide nutrition & aerobic exercise along with prescribed medications to achieve LDL <49m69mDL >40mg62mStress No   Take Less Medication Yes   Intervention Learn your risk factors and begin the lifestyle modifications for risk factor control during your time in the program.  Understand more about Heart/Pulmonary  Disease. Yes   Intervention While in program utilize professionals for any questions, and attend the education sessions. Great websites to use are www.americanheart.org or www.lung.org for reliable information.      Personal Goals and Risk Factors Review:      Goals and Risk Factor Review      04/08/15 0923 04/28/15 1111 05/27/15 0904 06/18/15 1016 08/14/15 1014   Core Components/Risk Factors/Patient Goals Review   Personal Goals Review Increase Aerobic Exercise and Physical Activity Weight Management Increase Aerobic Exercise and Physical Activity  Sedentary   Review     Left vm to check on [name]-"your neighbor said you got a large bill ($1500) from [facility]. I wanted to touch base with you. Maybe it was since the first of year you had a deductable.'. Neighbor said [name] probably won't be returning due to the large bill. Last visit was 07/28/2015   Expected Outcomes     To eventually be independent with his exercise plan when he completes Cardiac Rehab.    Weight Management (read-only)   Goals Progress/Improvement seen  Yes  Yes    Comments  Weight 184 today.  Patient feels the best when his weight is between 183 - 188.  He tries to keep his weight less than 190, which it is today, as he feels the best when it is less than 190 pounds.    Doing well with maintaining weight.    Increase Aerobic Exercise and Physical Activity (read-only)   Goals Progress/Improvement seen  Yes Yes Yes Yes    Comments [name] has increased his Card[facility] Rehab workloads.  In addition to coming to [facility] on Tuesday and Thursday mornings, [name] walks every 3/4th mile every morning.  [name] stated his anxiety has improved with exercise, as he is finding he does not need his anti-anxiety meds as often when he exercises.  Patient stated he is also sleeping better.   [name] continues to walk at home and is doing very well in the program. He has made increases on all of the equipment and he can continuously exercise for the  entire class time. We will continue to progress him by increasing his intensity. He has been encouraged to continue with his home exercise and we will follow up with him each month to ensure he continues to accumulate exercise at home.  Feels much better than at the beginning of the program, looking very forward to playing golf hopefully in March, as the weather warms.  Also going to try to maintain exercise regimen 2-3x a week    Hypertension (read-only)   Goal  --  Patient's BP the last 3 sessions has been 98 - 981 Systolic and 60 - 80  diastolic.    Participant will see blood pressure controlled within the values of 140/55m/Hg or within value directed by their physician.    Abnormal Lipids (read-only)   Goal  Cholesterol controlled with medications as prescribed, with individualized exercise RX and with personalized nutrition plan. Value goals: LDL < 744m HDL > 4047mParticipant states understanding of desired cholesterol values and following prescriptions.  Cholesterol medication as prescribed.  No new lab values at this time.  Continue medication for cholesterol as prescribed, diet, and exercise plan.           Personal Goals Discharge (Final Personal Goals and Risk Factors Review):      Goals and Risk Factor Review - 08/14/15 1014    Core Components/Risk Factors/Patient Goals Review  Personal Goals Review Sedentary   Review Left vm to check on [name]-"your neighbor said you got a large bill ($1500) from [facility]. I wanted to touch base with you. Maybe it was since the first of year you had a deductable.'. Neighbor said [name] probably won't be returning due to the large bill. Last visit was 07/28/2015   Expected Outcomes To eventually be independent with his exercise plan when he completes Cardiac Rehab.       ITP Comments:     ITP Comments      05/05/15 1349 06/25/15 1313 07/07/15 0847 07/21/15 0901 07/21/15 0902   ITP Comments 30 day review preparation  Continue with ITP Ready  for 30 day review. Continue with ITP. only 4 visits since last review [name] should meet his goals by visit 40.  30 Day Review.  Continue with ITP. last visit 07/09/2015     08/14/15 1013 08/20/15 1504         ITP Comments Left vm to check on [name]-"your neighbor said you got a large bill ($1500) from [facility]. I wanted to touch base with you. Maybe it was since the first of year you had a deductable.'. Neighbor said [name] probably won't be returning due to the large bill. Last visit was 07/28/2015 30 Day Review. Continue with the ITP.  Last visit 2/28         Comments:

## 2015-08-25 ENCOUNTER — Ambulatory Visit: Payer: 59

## 2015-09-03 ENCOUNTER — Encounter: Payer: Self-pay | Admitting: *Deleted

## 2015-09-03 DIAGNOSIS — Z951 Presence of aortocoronary bypass graft: Secondary | ICD-10-CM

## 2015-09-03 NOTE — Progress Notes (Signed)
Cardiac Individual Treatment Plan  Patient Details  Name: Tommy [name] MRN: [id number] Date of Birth: 01-12-1951 Referring Provider:  No ref. provider found  Initial Encounter Date:       Cardiac Rehab from 03/16/2015 in [facility] Cardiac and Pulmonary Rehab   Date  03/16/15      Visit Diagnosis: S/P CABG x 4  Patient's Home Medications on Admission:  Current outpatient prescriptions:  .  aspirin 81 MG chewable tablet, Chew 81 mg by mouth., Disp: , Rfl:  .  atorvastatin (LIPITOR) 80 MG tablet, Take 80 mg by mouth., Disp: , Rfl:  .  Glucosamine Sulfate 500 MG TABS, Take by mouth., Disp: , Rfl:  .  metoprolol tartrate (LOPRESSOR) 25 MG tablet, Take 25 mg by mouth., Disp: , Rfl:  .  Multiple Vitamin (MULTI-VITAMINS) TABS, Take by mouth., Disp: , Rfl:  .  nicotine (RA NICOTINE) 7 mg/24hr patch, Place onto the skin., Disp: , Rfl:  .  Omega-3 1000 MG CAPS, Take by mouth., Disp: , Rfl:  .  oxyCODONE (OXY IR/ROXICODONE) 5 MG immediate release tablet, Take 5 mg by mouth., Disp: , Rfl:   Past Medical History: No past medical history on file.  Tobacco Use: History  Smoking status  . Former Smoker  . Quit date: 02/23/2015  Smokeless tobacco  . Never Used    Labs: Recent Review Flowsheet Data    Labs for ITP Cardiac and Pulmonary Rehab Latest Ref Rng 08/22/2011 08/23/2011   Cholestrol 0-200 mg/dL - 184   LDLCALC 0-100 mg/dL - 124(H)   HDL 40-60 mg/dL - 38(L)   Trlycerides 0-200 mg/dL - 110   Hemoglobin A1c 4.2-6.3 % 7.1(H) -       Exercise Target Goals:    Exercise Program Goal: Individual exercise prescription set with THRR, safety & activity barriers. Participant demonstrates ability to understand and report RPE using BORG scale, to self-measure pulse accurately, and to acknowledge the importance of the exercise prescription.  Exercise Prescription Goal: Starting with aerobic activity 30 plus minutes a day, 3 days per week for initial exercise prescription. Provide  home exercise prescription and guidelines that participant acknowledges understanding prior to discharge.  Activity Barriers & Risk Stratification:     Activity Barriers & Cardiac Risk Stratification - 03/16/15 1347    Activity Barriers & Cardiac Risk Stratification   Activity Barriers Back Problems      6 Minute Walk:     6 Minute Walk      03/16/15 1412       6 Minute Walk   Phase Initial     Distance 1590 feet     Walk Time 6 minutes     RPE 11     Symptoms No     Resting HR 83 bpm     Resting BP 126/74 mmHg     Max Ex. HR 102 bpm     Max Ex. BP 138/70 mmHg        Initial Exercise Prescription:     Initial Exercise Prescription - 03/16/15 1400    Date of Initial Exercise Prescription   Date 03/16/15   Treadmill   MPH 3   Grade 0   Minutes 15   Bike   Level 1.4   Minutes 15   Recumbant Bike   Level 3   Watts 30   Minutes 15   NuStep   Level 4   Watts 50   Minutes 15   Arm Ergometer   Level 1  Watts 10   Minutes 10   Arm/Foot Ergometer   Level 1   Watts 15   Minutes 10   Cybex   Level 3   RPM 30   Minutes 15   Recumbant Elliptical   Level 2   Watts 25   Minutes 15   Elliptical   Level 1   Speed 3   Minutes 5   REL-XR   Level 3   Watts 30   Minutes 15   Prescription Details   Frequency (times per week) 3   Duration Progress to 30 minutes of continuous aerobic without signs/symptoms of physical distress   Intensity   THRR REST +  30   Ratings of Perceived Exertion 11-15   Progression   Progression Continue progressive overload as per policy without signs/symptoms or physical distress.   Resistance Training   Training Prescription Yes  ROM only if needed due to muscle soreness   Weight 1   Reps 10-12      Perform Capillary Blood Glucose checks as needed.  Exercise Prescription Changes:     Exercise Prescription Changes      03/24/15 0900 03/31/15 0900 04/07/15 1400 04/14/15 1634 04/28/15 1000   Exercise Review    Progression Yes Yes No No No   Response to Exercise   Blood Pressure (Admit)   118/76 mmHg 118/80 mmHg    Blood Pressure (Exercise)   132/72 mmHg 140/72 mmHg    Blood Pressure (Exit)   102/64 mmHg 98/60 mmHg    Heart Rate (Admit)   109 bpm 89 bpm    Heart Rate (Exercise)   123 bpm 128 bpm    Heart Rate (Exit)   87 bpm 96 bpm    Rating of Perceived Exertion (Exercise)   12 12    Symptoms  no no no None   Comments [name] did well his first of class with no signs or symptoms. I increased him to level 4 in the XR and he was able to complete that with no concerns.  I spoke with [name] about his increasing his speed on the treadmill and level on the Nustep. He completed those increases today in class without signs or symptoms.    Reviewed individualized exercise prescription and made increases per departmental policy. Exercise increases were discussed with the patient and they were able to perform the new work loads without issue (no signs or symptoms).    Duration   Progress to 30 minutes of continuous aerobic without signs/symptoms of physical distress Progress to 30 minutes of continuous aerobic without signs/symptoms of physical distress Progress to 30 minutes of continuous aerobic without signs/symptoms of physical distress   Intensity   Rest + 30 Rest + 30 Rest + 30   Progression   Progression   Continue progressive overload as per policy without signs/symptoms or physical distress. Continue progressive overload as per policy without signs/symptoms or physical distress. Continue progressive overload as per policy without signs/symptoms or physical distress.   Resistance Training   Training Prescription (read-only)  Yes Yes Yes Yes   Weight (read-only)  3 3 3 3    Reps (read-only)  10-15 10-15 10-15 10-15   Interval Training   Interval Training  No No No No   Treadmill   MPH (read-only)  3.2 3.2 3.2 3.2   Grade (read-only)  0 0 0 1   Minutes (read-only)  15 15 15 20    NuStep   Level (read-only)   2 2 2  3   Watts (read-only)  30 30 30  40   Minutes (read-only)  15 15 15 20    REL-XR   Level (read-only) 5 5 5 5 5    Watts (read-only) 55 55 55 55 55   Minutes (read-only) 15 15 15 15 15      05/05/15 0900 05/14/15 0900 05/21/15 1000 05/21/15 1454 06/11/15 0713   Exercise Review   Progression No Yes Yes Yes    Response to Exercise   Blood Pressure (Admit)    116/64 mmHg 114/62 mmHg   Blood Pressure (Exercise)    128/76 mmHg 142/82 mmHg   Blood Pressure (Exit)    116/70 mmHg 106/62 mmHg   Heart Rate (Admit)    76 bpm 96 bpm   Heart Rate (Exercise)    110 bpm 120 bpm   Heart Rate (Exit)    85 bpm 78 bpm   Rating of Perceived Exertion (Exercise)    12 12   Symptoms None None None None None   Comments [name] will be traveling for the next week. I reviewed with him things he can do while he is traveling in order to maintain his exercise progress. He plans on doing a lot of walking and staying active while he is away. [name] will be traveling for the next week. I reviewed with him things he can do while he is traveling in order to maintain his exercise progress. He plans on doing a lot of walking and staying active while he is away. Patient started interval training. Interval training guidelines were discussed with the patient. He tolerated these increases with no signs or symptoms.      Duration Progress to 30 minutes of continuous aerobic without signs/symptoms of physical distress Progress to 30 minutes of continuous aerobic without signs/symptoms of physical distress Progress to 30 minutes of continuous aerobic without signs/symptoms of physical distress Progress to 30 minutes of continuous aerobic without signs/symptoms of physical distress Progress to 30 minutes of continuous aerobic without signs/symptoms of physical distress   Intensity Rest + 30 Rest + 30 Rest + 30 Rest + 30 Rest + 30   Progression   Progression Continue progressive overload as per policy without signs/symptoms or physical  distress. Continue progressive overload as per policy without signs/symptoms or physical distress. Continue progressive overload as per policy without signs/symptoms or physical distress. Continue progressive overload as per policy without signs/symptoms or physical distress. Continue progressive overload as per policy without signs/symptoms or physical distress.   Resistance Training   Training Prescription (read-only) Yes Yes Yes Yes Yes   Weight (read-only) 3 3 3 3 3    Reps (read-only) 10-15 10-15 10-15 10-15 10-15   Interval Training   Interval Training No No Yes Yes Yes   Equipment   Treadmill Treadmill Treadmill   Comments   3.2/ 2-4% 3.2/ 2-4% 3.2/ 2-4%   Treadmill   MPH (read-only) 3.2 3.2 3.2 3.2 3.2   Grade (read-only) 1 2 4   Interval training from 2-4% 4  Interval training from 2-4% 4  Interval training from 2-4%   Minutes (read-only) 20 20 20 20 20    NuStep   Level (read-only) 3 3 3 3 3    Watts (read-only) 40 40 40 40 40   Minutes (read-only) 20 20 20 20 20    REL-XR   Level (read-only) 5 5 5 5 5    Watts (read-only) 55 55 55 55 55   Minutes (read-only) 15 15 15 15 15    T5 Nustep  Level (read-only)  4 4 4 4    Watts (read-only)  40 40 40 40   Minutes (read-only)  15 15 15 15      06/16/15 0709 06/23/15 0900 07/07/15 0900 07/09/15 0900 07/28/15 0938   Exercise Review   Progression  Yes Yes Yes --   Response to Exercise   Blood Pressure (Admit) 114/62 mmHg   120/70 mmHg 122/74 mmHg   Blood Pressure (Exercise) 142/82 mmHg   166/82 mmHg 132/64 mmHg   Blood Pressure (Exit) 106/62 mmHg   104/68 mmHg 110/70 mmHg   Heart Rate (Admit) 96 bpm   82 bpm 97 bpm   Heart Rate (Exercise) 120 bpm   132 bpm 106 bpm   Heart Rate (Exit) 78 bpm   94 bpm 94 bpm   Rating of Perceived Exertion (Exercise) 12   12 12    Symptoms None None None None --   Comments  Adjusted [name]'s interval training to make it challenging. [name] was able to perform it with no problems and appreciates the  individualized program. Adjusted [name]'s interval training to make it challenging. [name] was able to perform it with no problems and appreciates the individualized program. [name] WAS ASKED TO ADD 2 [place] has not attended Heart Track since 2/28.  He will be contacted to determine barriers to attending and completing program.   Duration Progress to 30 minutes of continuous aerobic without signs/symptoms of physical distress Progress to 30 minutes of continuous aerobic without signs/symptoms of physical distress Progress to 30 minutes of continuous aerobic without signs/symptoms of physical distress Progress to 30 minutes of continuous aerobic without signs/symptoms of physical distress --   Intensity Rest + 30 Rest + 30 Rest + 30 Rest + 30 --   Progression   Progression Continue progressive overload as per policy without signs/symptoms or physical distress. Continue progressive overload as per policy without signs/symptoms or physical distress. Continue progressive overload as per policy without signs/symptoms or physical distress. Continue progressive overload as per policy without signs/symptoms or physical distress. --   Horticulturist, commercial Prescription (read-only) Yes Yes Yes Yes    Weight (read-only) 3 3 3 3     Reps (read-only) 10-15 10-15 10-15 10-15    Interval Training   Interval Training Yes Yes Yes Yes Yes   Equipment Treadmill Treadmill Treadmill Treadmill Treadmill   Comments 3.2/ 2-4% 3.4/ 2-5% 3.4/ 2-5% 3.4/ 2-5% 3.4/ 2-5%   Treadmill   MPH (read-only) 3.2 3.2 3.2 3.5    Grade (read-only) 4  Interval training from 2-4% 4  Interval training from 2-4% 4  Interval training from 2-4% 5  Interval training from 2-5%    Minutes (read-only) 20 20 20 20     NuStep   Level (read-only) 3 3 3 3     Watts (read-only) 40 40 40 40    Minutes (read-only) 20 20 20 20     REL-XR   Level (read-only) 5 5 5 5     Watts (read-only) 55 55 55 55     Minutes (read-only) 15 15 15 15     T5 Nustep   Level (read-only) 4 4 4 5     Watts (read-only) 40 40 40 50    Minutes (read-only) 15 15 15 15     Home Exercise Plan   Frequency    Add 2 additional days to program exercise sessions. --      Exercise Comments:     Exercise Comments  09/03/15 1349           Exercise Comments Last visit was 07/28/2015 completed 33/36 sessions. Was doing interval training on the treadmill with no problem.          Discharge Exercise Prescription (Final Exercise Prescription Changes):     Exercise Prescription Changes - 07/28/15 0938    Exercise Review   Progression --   Response to Exercise   Blood Pressure (Admit) 122/74 mmHg   Blood Pressure (Exercise) 132/64 mmHg   Blood Pressure (Exit) 110/70 mmHg   Heart Rate (Admit) 97 bpm   Heart Rate (Exercise) 106 bpm   Heart Rate (Exit) 94 bpm   Rating of Perceived Exertion (Exercise) 12   Symptoms --   Comments [name] has not attended Heart Track since 2/28.  He will be contacted to determine barriers to attending and completing program.   Duration --   Intensity --   Progression   Progression --   Interval Training   Interval Training Yes   Equipment Treadmill   Comments 3.4/ 2-5%   Home Exercise Plan   Frequency --      Nutrition:  Target Goals: Understanding of nutrition guidelines, daily intake of sodium <1537m, cholesterol <2076m calories 30% from fat and 7% or less from saturated fats, daily to have 5 or more servings of fruits and vegetables.  Biometrics:     Pre Biometrics - 03/16/15 1419    Pre Biometrics   Height 5' 10.25" (1.784 m)   Weight 183 lb 1.6 oz (83.054 kg)   Waist Circumference 37 inches   Hip Circumference 40 inches   Waist to Hip Ratio 0.92 %   BMI (Calculated) 26.1       Nutrition Therapy Plan and Nutrition Goals:   Nutrition Discharge: Rate Your Plate Scores:   Nutrition Goals Re-Evaluation:     Nutrition Goals Re-Evaluation      04/08/15  0923 08/14/15 1015 09/03/15 1350       Personal Goal #1 Re-Evaluation   Personal Goal #1 [name] eats healthy. Has a good BMI also.        Goal Progress Seen Yes Yes Yes     Comments  DoMarden Noblelready eats healthy. Last visit 2/29/2017 due to large bill.          Psychosocial: Target Goals: Acknowledge presence or absence of depression, maximize coping skills, provide positive support system. Participant is able to verbalize types and ability to use techniques and skills needed for reducing stress and depression.  Initial Review & Psychosocial Screening:     Initial Psych Review & Screening - 03/16/15 1444    Screening Interventions   Interventions Encouraged to exercise      Quality of Life Scores:   PHQ-9:     Recent Review Flowsheet Data    [facility]   Decreased Interest 0   Down, Depressed, Hopeless 1   PHQ - 2 Score 1   Altered sleeping 1   Tired, decreased energy 1   Change in appetite 1   Feeling bad or failure about yourself  1   Trouble concentrating 0   Moving slowly or fidgety/restless 0   Suicidal thoughts 0   PHQ-9 Score 5   Difficult doing work/chores Not difficult at all      Psychosocial Evaluation and Intervention:     Psychosocial Evaluation - 03/31/15 0954    Psychosocial Evaluation & Interventions   Interventions Stress management education;Relaxation education;Encouraged to exercise with the  program and follow exercise prescription   Comments Counselor met with Mr. Muzzy today.  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  He has a strong support system with a spouse of 1 years and an adult son and his significant other who live in the home with them.  He reports sleeping better lately and his appetite is good.  He also states he is typically positive, although he has had some post surgery depressive symtpoms that are making it difficult for him to quit smoking as a coping mechanism.   Counselor recommended he speak with his doctor about possible medication for these depressive symptoms to help treat this as well as decrease the need to smoke.  Mr. 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     Continued Psychosocial Services Needed Yes  Mr. Yonker will benefit from talking with his doctor about medication for mood symptoms  as well as participating in the psychoeducational components of this program.        Psychosocial Re-Evaluation:     Psychosocial Re-Evaluation      04/08/15 0924 07/07/15 0847 08/14/15 1015 09/03/15 1353     Psychosocial Re-Evaluation   Interventions Encouraged to attend Cardiac Rehabilitation for the exercise  Encouraged to attend Cardiac Rehabilitation for the exercise     Comments  [name] said he is feeling calmer after he asked his MD for something to make him calmer since he reported he was anxious that he may have another heart event.  Left vm to check on [name]-"your neighbor said you got a large bill ($1500) from Cardiac Rehab. I wanted to touch base with you. Maybe it was since the first of year you had a deductable.'. Neighbor said [name] probably won't be returning due to the large bill. Last visit was 07/28/2015 Last visit was 07/28/2015 completed 33/36 sessions. Was doing interval training on the treadmill with no problem.       Vocational Rehabilitation: Provide vocational rehab assistance to qualifying candidates.   Vocational Rehab Evaluation & Intervention:     Vocational Rehab - 03/16/15 1350    Initial Vocational Rehab Evaluation & Intervention   Assessment shows need for Vocational Rehabilitation No      Education: Education Goals: Education classes will be provided on a weekly basis, covering required topics. Participant will state understanding/return demonstration of topics presented.  Learning Barriers/Preferences:     Learning  Barriers/Preferences - 03/16/15 1350    Learning Barriers/Preferences   Learning Barriers None   Learning Preferences None      Education Topics: General Nutrition Guidelines/Fats and Fiber: -Group instruction provided by verbal, written material, models and posters to present the general guidelines for heart healthy nutrition. Gives an explanation and review of dietary fats and fiber.          Cardiac Rehab from 07/28/2015 in [facility] Cardiac and Pulmonary Rehab   Date  07/07/15   Educator  Karolee Stamps, RD   Instruction Review Code  2- meets goals/outcomes      Controlling Sodium/Reading Food Labels: -Group verbal and written material supporting the discussion of sodium use in heart healthy nutrition. Review and explanation with models, verbal and written materials for utilization of the food label.   Exercise Physiology & Risk Factors: - Group verbal and written instruction with models to review the exercise physiology of the cardiovascular system and  associated critical values. Details cardiovascular disease risk factors and the goals associated with each risk factor.      Cardiac Rehab from 07/28/2015 in [facility] Cardiac and Pulmonary Rehab   Date  07/28/15   Educator  BS   Instruction Review Code  2- meets goals/outcomes      Aerobic Exercise & Resistance Training: - Gives group verbal and written discussion on the health impact of inactivity. On the components of aerobic and resistive training programs and the benefits of this training and how to safely progress through these programs.      Cardiac Rehab from 07/28/2015 in [facility] Cardiac and Pulmonary Rehab   Date  04/07/15   Educator  RM   Instruction Review Code  2- meets goals/outcomes      Flexibility, Balance, General Exercise Guidelines: - Provides group verbal and written instruction on the benefits of flexibility and balance training programs. Provides general exercise guidelines with specific guidelines to those with  heart or lung disease. Demonstration and skill practice provided.      Cardiac Rehab from 07/28/2015 in [facility] Cardiac and Pulmonary Rehab   Date  04/14/15   Educator  RM   Instruction Review Code  2- meets goals/outcomes      Stress Management: - Provides group verbal and written instruction about the health risks of elevated stress, cause of high stress, and healthy ways to reduce stress.      Cardiac Rehab from 07/28/2015 in [facility] Cardiac and Pulmonary Rehab   Date  06/16/15   Educator  C.Cy Bresee, RN   Instruction Review Code  2- meets goals/outcomes      Depression: - Provides group verbal and written instruction on the correlation between heart/lung disease and depressed mood, treatment options, and the stigmas associated with seeking treatment.      Cardiac Rehab from 07/28/2015 in [facility] Cardiac and Pulmonary Rehab   Date  05/14/15   Educator  C. Alleta Avery. RN   Instruction Review Code  2- meets goals/outcomes      Anatomy & Physiology of the Heart: - Group verbal and written instruction and models provide basic cardiac anatomy and physiology, with the coronary electrical and arterial systems. Review of: AMI, Angina, Valve disease, Heart Failure, Cardiac Arrhythmia, Pacemakers, and the ICD.      Cardiac Rehab from 07/28/2015 in [facility] Cardiac and Pulmonary Rehab   Date  06/18/15   Educator  CE   Instruction Review Code  2- meets goals/outcomes      Cardiac Procedures: - Group verbal and written instruction and models to describe the testing methods done to diagnose heart disease. Reviews the outcomes of the test results. Describes the treatment choices: Medical Management, Angioplasty, or Coronary Bypass Surgery.      Cardiac Rehab from 07/28/2015 in [facility] Cardiac and Pulmonary Rehab   Date  03/24/15   Educator  DW   Instruction Review Code  2- meets goals/outcomes      Cardiac Medications: - Group verbal and written instruction to review commonly prescribed medications for  heart disease. Reviews the medication, class of the drug, and side effects. Includes the steps to properly store meds and maintain the prescription regimen.      Cardiac Rehab from 07/28/2015 in [facility] Cardiac and Pulmonary Rehab   Date  07/09/15   Educator  dw   Instruction Review Code  2- meets goals/outcomes      Go Sex-Intimacy & Heart Disease, Get SMART - Goal Setting: - Group verbal and written instruction through  game format to discuss heart disease and the return to sexual intimacy. Provides group verbal and written material to discuss and apply goal setting through the application of the S.M.A.R.T. Method.      Cardiac Rehab from 07/28/2015 in [facility] Cardiac and Pulmonary Rehab   Date  03/24/15   Educator  DW   Instruction Review Code  2- meets goals/outcomes      Other Matters of the Heart: - Provides group verbal, written materials and models to describe Heart Failure, Angina, Valve Disease, and Diabetes in the realm of heart disease. Includes description of the disease process and treatment options available to the cardiac patient.      Cardiac Rehab from 07/28/2015 in [facility] Cardiac and Pulmonary Rehab   Date  06/30/15   Educator  DW   Instruction Review Code  2- meets goals/outcomes      Exercise & Equipment Safety: - Individual verbal instruction and demonstration of equipment use and safety with use of the equipment.      Cardiac Rehab from 07/28/2015 in [facility] Cardiac and Pulmonary Rehab   Date  03/16/15   Educator  Sb   Instruction Review Code  2- meets goals/outcomes      Infection Prevention: - Provides verbal and written material to individual with discussion of infection control including proper hand washing and proper equipment cleaning during exercise session.      Cardiac Rehab from 07/28/2015 in [facility] Cardiac and Pulmonary Rehab   Date  03/16/15   Educator  SB   Instruction Review Code  2- meets goals/outcomes      Falls Prevention: - Provides verbal and  written material to individual with discussion of falls prevention and safety.      Cardiac Rehab from 07/28/2015 in [facility] Cardiac and Pulmonary Rehab   Date  03/16/15   Educator  sb   Instruction Review Code  2- meets goals/outcomes      Diabetes: - Individual verbal and written instruction to review signs/symptoms of diabetes, desired ranges of glucose level fasting, after meals and with exercise. Advice that pre and post exercise glucose checks will be done for 3 sessions at entry of program.    Knowledge Questionnaire Score:   Core Components/Risk Factors/Patient Goals at Admission:     Personal Goals and Risk Factors at Admission - 03/16/15 1353    Core Components/Risk Factors/Patient Goals on Admission    Weight Management Yes   Intervention (read-only) Learn and follow the exercise and diet guidelines while in the program. Utilize the nutrition and education classes to help gain knowledge of the diet and exercise expectations in the program  HAs been working on weight loss for diabetes prevention.   Admit Weight 183 lb (83.008 kg)   Goal Weight: Short Term 185 lb (83.915 kg)   Sedentary Yes  Is walking 4 days a week at least 30 min.   Intervention (read-only) While in program, learn and follow the exercise prescription taught. Start at a low level workload and increase workload after able to maintain previous level for 30 minutes. Increase time before increasing intensity.   Diabetes No   Hypertension Yes   Goal Participant will see blood pressure controlled within the values of 140/10m/Hg or within value directed by their physician.   Intervention (read-only) Provide nutrition & aerobic exercise along with prescribed medications to achieve BP 140/90 or less.   Lipids Yes   Goal Cholesterol controlled with medications as prescribed, with individualized exercise RX and with personalized nutrition  plan. Value goals: LDL < 58m, HDL > 477m Participant states understanding of  desired cholesterol values and following prescriptions.   Intervention (read-only) Provide nutrition & aerobic exercise along with prescribed medications to achieve LDL <7037mHDL >36m65m Stress No   Take Less Medication Yes   Intervention Learn your risk factors and begin the lifestyle modifications for risk factor control during your time in the program.   Understand more about Heart/Pulmonary Disease. Yes   Intervention While in program utilize professionals for any questions, and attend the education sessions. Great websites to use are www.americanheart.org or www.lung.org for reliable information.      Core Components/Risk Factors/Patient Goals Review:      Goals and Risk Factor Review      04/08/15 0923 04/28/15 1111 05/27/15 0904 06/18/15 1016 08/14/15 1014   Core Components/Risk Factors/Patient Goals Review   Personal Goals Review Increase Aerobic Exercise and Physical Activity Weight Management Increase Aerobic Exercise and Physical Activity  Sedentary   Review     Left vm to check on [name]-"your neighbor said you got a large bill ($1500) from Cardiac Rehab. I wanted to touch base with you. Maybe it was since the first of year you had a deductable.'. Neighbor said DougMarden Noblebably won't be returning due to the large bill. Last visit was 07/28/2015   Expected Outcomes     To eventually be independent with his exercise plan when he completes Cardiac Rehab.    Weight Management (read-only)   Goals Progress/Improvement seen  Yes  Yes    Comments  Weight 184 today.  Patient feels the best when his weight is between 183 - 188.  He tries to keep his weight less than 190, which it is today, as he feels the best when it is less than 190 pounds.    Doing well with maintaining weight.    Increase Aerobic Exercise and Physical Activity (read-only)   Goals Progress/Improvement seen  Yes Yes Yes Yes    Comments DougMarden Noble increased his Cardiac Rehab workloads.  In addition to coming to Cardiac Rehab on  Tuesday and Thursday mornings, [name] walks every 3/4th mile every morning.  DougMarden Nobleted his anxiety has improved with exercise, as he is finding he does not need his anti-anxiety meds as often when he exercises.  Patient stated he is also sleeping better.   DougMarden Nobletinues to walk at home and is doing very well in the program. He has made increases on all of the equipment and he can continuously exercise for the entire class time. We will continue to progress him by increasing his intensity. He has been encouraged to continue with his home exercise and we will follow up with him each month to ensure he continues to accumulate exercise at home.  Feels much better than at the beginning of the program, looking very forward to playing golf hopefully in March, as the weather warms.  Also going to try to maintain exercise regimen 2-3x a week    Hypertension (read-only)   Goal  --  Patient's BP the last 3 sessions has been 98 - 124 242tolic and 60 - 80  diastolic.    Participant will see blood pressure controlled within the values of 140/90mm76mor within value directed by their physician.    Abnormal Lipids (read-only)   Goal  Cholesterol controlled with medications as prescribed, with individualized exercise RX and with personalized nutrition plan. Value goals: LDL < 70mg,28m > 36mg. 60micipant states understanding of  desired cholesterol values and following prescriptions.  Cholesterol medication as prescribed.  No new lab values at this time.  Continue medication for cholesterol as prescribed, diet, and exercise plan.          09/03/15 1350           Core Components/Risk Factors/Patient Goals Review   Expected Outcomes Last visit was 07/28/2015 completed 33/36 sessions. Was doing interval training on the treadmill with no problem.          Core Components/Risk Factors/Patient Goals at Discharge (Final Review):      Goals and Risk Factor Review - 09/03/15 1350    Core Components/Risk Factors/Patient  Goals Review   Expected Outcomes Last visit was 07/28/2015 completed 33/36 sessions. Was doing interval training on the treadmill with no problem.      ITP Comments:     ITP Comments      05/05/15 1349 06/25/15 1313 07/07/15 0847 07/21/15 0901 07/21/15 0902   ITP Comments 30 day review preparation  Continue with ITP Ready for 30 day review. Continue with ITP. only 4 visits since last review [name] should meet his goals by visit 38.  30 Day Review.  Continue with ITP. last visit 07/09/2015     08/14/15 1013 08/20/15 1504 09/03/15 1348       ITP Comments Left vm to check on [name]-"your neighbor said you got a large bill ($1500) from Cardiac Rehab. I wanted to touch base with you. Maybe it was since the first of year you had a deductable.'. Neighbor said [name] probably won't be returning due to the large bill. Last visit was 07/28/2015 30 Day Review. Continue with the ITP.  Last visit 2/28 Last visit was 07/28/2015 completed 33/36 sessions.         Comments:

## 2015-09-03 NOTE — Progress Notes (Signed)
Discharge Summary  Patient Details  Name: Tommy [name] MRN: [id number] Date of Birth: [birth year] Referring Provider:  No ref. provider found   Number of Visits: Completed 33/36 of sessions  Reason for Discharge:  Patient reached a stable level of exercise. Patient independent in their exercise.  Smoking History:  History  Smoking status  . Former Smoker  . Quit date: 02/23/2015  Smokeless tobacco  . Never Used    Diagnosis:  S/P CABG x 4 - Plan: CARDIAC REHAB 30 DAY REVIEW  ADL UCSD:   Initial Exercise Prescription:     Initial Exercise Prescription - 03/16/15 1400    Date of Initial Exercise Prescription   Date 03/16/15   Treadmill   MPH 3   Grade 0   Minutes 15   Bike   Level 1.4   Minutes 15   Recumbant Bike   Level 3   Watts 30   Minutes 15   NuStep   Level 4   Watts 50   Minutes 15   Arm Ergometer   Level 1   Watts 10   Minutes 10   Arm/Foot Ergometer   Level 1   Watts 15   Minutes 10   Cybex   Level 3   RPM 30   Minutes 15   Recumbant Elliptical   Level 2   Watts 25   Minutes 15   Elliptical   Level 1   Speed 3   Minutes 5   REL-XR   Level 3   Watts 30   Minutes 15   Prescription Details   Frequency (times per week) 3   Duration Progress to 30 minutes of continuous aerobic without signs/symptoms of physical distress   Intensity   THRR REST +  30   Ratings of Perceived Exertion 11-15   Progression   Progression Continue progressive overload as per policy without signs/symptoms or physical distress.   Resistance Training   Training Prescription Yes  ROM only if needed due to muscle soreness   Weight 1   Reps 10-12      Discharge Exercise Prescription (Final Exercise Prescription Changes):     Exercise Prescription Changes - 07/28/15 0938    Exercise Review   Progression --   Response to Exercise   Blood Pressure (Admit) 122/74 mmHg   Blood Pressure (Exercise) 132/64 mmHg   Blood Pressure (Exit) 110/70 mmHg    Heart Rate (Admit) 97 bpm   Heart Rate (Exercise) 106 bpm   Heart Rate (Exit) 94 bpm   Rating of Perceived Exertion (Exercise) 12   Symptoms --   Comments [name] has not attended Heart [facility] since 2/28.  He will be contacted to determine barriers to attending and completing program.   Duration --   Intensity --   Progression   Progression --   Interval Training   Interval Training Yes   Equipment Treadmill   Comments 3.4/ 2-5%   Home Exercise Plan   Frequency --      Functional Capacity:     6 Minute Walk      03/16/15 1412       6 Minute Walk   Phase Initial     Distance 1590 feet     Walk Time 6 minutes     RPE 11     Symptoms No     Resting HR 83 bpm     Resting BP 126/74 mmHg     Max Ex. HR 102 bpm  Max Ex. BP 138/70 mmHg        Psychological, QOL, Others - Outcomes: PHQ 2/9: Depression screen PHQ 2/9 03/16/2015  Decreased Interest 0  Down, Depressed, Hopeless 1  PHQ - 2 Score 1  Altered sleeping 1  Tired, decreased energy 1  Change in appetite 1  Feeling bad or failure about yourself  1  Trouble concentrating 0  Moving slowly or fidgety/restless 0  Suicidal thoughts 0  PHQ-9 Score 5  Difficult doing work/chores Not difficult at all    Quality of Life:   Personal Goals: Goals established at orientation with interventions provided to work toward goal.     Personal Goals and Risk Factors at Admission - 03/16/15 1353    Core Components/Risk Factors/Patient Goals on Admission    Weight Management Yes   Intervention (read-only) Learn and follow the exercise and diet guidelines while in the program. Utilize the nutrition and education classes to help gain knowledge of the diet and exercise expectations in the program  HAs been working on weight loss for diabetes prevention.   Admit Weight 183 lb (83.008 kg)   Goal Weight: Short Term 185 lb (83.915 kg)   Sedentary Yes  Is walking 4 days a week at least 30 min.   Intervention (read-only) While  in program, learn and follow the exercise prescription taught. Start at a low level workload and increase workload after able to maintain previous level for 30 minutes. Increase time before increasing intensity.   Diabetes No   Hypertension Yes   Goal Participant will see blood pressure controlled within the values of 140/6390mm/Hg or within value directed by their physician.   Intervention (read-only) Provide nutrition & aerobic exercise along with prescribed medications to achieve BP 140/90 or less.   Lipids Yes   Goal Cholesterol controlled with medications as prescribed, with individualized exercise RX and with personalized nutrition plan. Value goals: LDL < 70mg , HDL > 40mg . Participant states understanding of desired cholesterol values and following prescriptions.   Intervention (read-only) Provide nutrition & aerobic exercise along with prescribed medications to achieve LDL 70mg , HDL >40mg .   Stress No   Take Less Medication Yes   Intervention Learn your risk factors and begin the lifestyle modifications for risk factor control during your time in the program.   Understand more about Heart/Pulmonary Disease. Yes   Intervention While in program utilize professionals for any questions, and attend the education sessions. Great websites to use are www.americanheart.org or www.lung.org for reliable information.       Personal Goals Discharge:     Goals and Risk Factor Review      04/08/15 0923 04/28/15 1111 05/27/15 0904 06/18/15 1016 08/14/15 1014   Core Components/Risk Factors/Patient Goals Review   Personal Goals Review Increase Aerobic Exercise and Physical Activity Weight Management Increase Aerobic Exercise and Physical Activity  Sedentary   Review     Left vm to check on [name]-"your neighbor said you got a large bill ($1500) from Cardiac Rehab. I wanted to touch base with you. Maybe it was since the first of year you had a deductable.'. Neighbor said [name] probably won't be returning due  to the large bill. Last visit was 07/28/2015   Expected Outcomes     To eventually be independent with his exercise plan when he completes Cardiac Rehab.    Weight Management (read-only)   Goals Progress/Improvement seen  Yes  Yes    Comments  Weight 184 today.  Patient feels the best when his  weight is between 183 - 188.  He tries to keep his weight less than 190, which it is today, as he feels the best when it is less than 190 pounds.    Doing well with maintaining weight.    Increase Aerobic Exercise and Physical Activity (read-only)   Goals Progress/Improvement seen  Yes Yes Yes Yes    Comments [name] has increased his Cardiac Rehab workloads.  In addition to coming to Cardiac Rehab on Tuesday and Thursday mornings, [name] walks every 3/4th mile every morning.  [name] stated his anxiety has improved with exercise, as he is finding he does not need his anti-anxiety meds as often when he exercises.  Patient stated he is also sleeping better.   [name] continues to walk at home and is doing very well in the program. He has made increases on all of the equipment and he can continuously exercise for the entire class time. We will continue to progress him by increasing his intensity. He has been encouraged to continue with his home exercise and we will follow up with him each month to ensure he continues to accumulate exercise at home.  Feels much better than at the beginning of the program, looking very forward to playing golf hopefully in March, as the weather warms.  Also going to try to maintain exercise regimen 2-3x a week    Hypertension (read-only)   Goal  --  Patient's BP the last 3 sessions has been 98 - 124 Systolic and 60 - 80  diastolic.    Participant will see blood pressure controlled within the values of 140/41mm/Hg or within value directed by their physician.    Abnormal Lipids (read-only)   Goal  Cholesterol controlled with medications as prescribed, with individualized exercise RX and with  personalized nutrition plan. Value goals: LDL < , HDL > . Participant states understanding of desired cholesterol values and following prescriptions.  Cholesterol medication as prescribed.  No new lab values at this time.  Continue medication for cholesterol as prescribed, diet, and exercise plan.          09/03/15 1350           Core Components/Risk Factors/Patient Goals Review   Expected Outcomes Last visit was 07/28/2015 completed 33/36 sessions. Was doing interval training on the treadmill with no problem.          Nutrition & Weight - Outcomes:     Pre Biometrics - 03/16/15 1419    Pre Biometrics   Height 5' 10.25" (1.784 m)   Weight 183 lb 1.6 oz (83.054 kg)   Waist Circumference 37 inches   Hip Circumference 40 inches   Waist to Hip Ratio 0.92 %   BMI (Calculated) 26.1       Nutrition:   Nutrition Discharge:   Education Questionnaire Score:   Goals reviewed with patient; copy given to patient.

## 2022-10-03 ENCOUNTER — Other Ambulatory Visit: Payer: Self-pay

## 2022-10-03 DIAGNOSIS — Z87891 Personal history of nicotine dependence: Secondary | ICD-10-CM

## 2022-10-03 DIAGNOSIS — Z122 Encounter for screening for malignant neoplasm of respiratory organs: Secondary | ICD-10-CM

## 2022-10-20 ENCOUNTER — Encounter: Payer: Self-pay | Admitting: Internal Medicine

## 2022-10-21 ENCOUNTER — Encounter: Payer: Self-pay | Admitting: Orthopedic Surgery

## 2022-10-21 ENCOUNTER — Ambulatory Visit: Payer: Medicare Other | Admitting: Orthopedic Surgery

## 2022-10-21 VITALS — BP 122/72 | Ht 70.0 in | Wt 169.8 lb

## 2022-10-21 DIAGNOSIS — M47816 Spondylosis without myelopathy or radiculopathy, lumbar region: Secondary | ICD-10-CM

## 2022-10-21 DIAGNOSIS — M5136 Other intervertebral disc degeneration, lumbar region: Secondary | ICD-10-CM

## 2022-10-21 DIAGNOSIS — M4726 Other spondylosis with radiculopathy, lumbar region: Secondary | ICD-10-CM

## 2022-10-21 DIAGNOSIS — Z9889 Other specified postprocedural states: Secondary | ICD-10-CM

## 2022-10-21 DIAGNOSIS — M5416 Radiculopathy, lumbar region: Secondary | ICD-10-CM

## 2022-10-21 MED ORDER — METHYLPREDNISOLONE 4 MG PO TBPK
ORAL_TABLET | ORAL | 0 refills | Status: DC
Start: 2022-10-21 — End: 2022-11-03

## 2022-10-21 NOTE — Progress Notes (Signed)
Referring Physician:  No referring provider defined for this encounter.  Primary Physician:  Tommy Boyden, MD  History of Present Illness: 10/21/2022 Tommy [name] has a history of COPD, CAD with h/o CABG, HTN, and, hyperlipidemia.  History of lumbar decompression at L4-L5 in 2013. Multiple complications with this surgery with difficult intubation, falling off bed, and being put in a coma. 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 His pain never improved after the surgery.   He has 3-4 month history of worsening constant LBP with more constant right leg pain that runs medial to knee and then to his calf. Not having much left leg pain. Pain is worse with lifting. Not sleeping well due to pain. He has numbness, tingling, and weakness in right > left leg.   Bowel/Bladder Dysfunction: none  He quit smoking Thanksgiving 2023.   Conservative measures:  Physical therapy: no recent Multimodal medical therapy including regular antiinflammatories: oxycodone, flexeril, norco 5, prednisone Injections: No recent epidural steroid injections  Past Surgery:  lumbar decompression L4-L5 in 2013 Dr. Verdene Rio has no symptoms of cervical myelopathy.  The symptoms are causing a significant impact on the patient's life.   Review of Systems:  A 10 point review of systems is negative, except for the pertinent positives and negatives detailed in the HPI.  Past Medical History: History reviewed. No pertinent past medical history.  Past Surgical History: History reviewed. No pertinent surgical history.  Allergies: Allergies as of 10/21/2022   (No Known Allergies)    Medications: Outpatient Encounter Medications as of 10/21/2022  Medication Sig   methylPREDNISolone (MEDROL DOSEPAK) 4 MG TBPK tablet Use as directed x 6 days.   atorvastatin (LIPITOR) 80 MG tablet Take 80 mg by mouth.   Glucosamine Sulfate 500 MG TABS Take by mouth.   metoprolol  tartrate (LOPRESSOR) 25 MG tablet Take 25 mg by mouth.   Multiple Vitamin (MULTI-VITAMINS) TABS Take by mouth.   nicotine (RA NICOTINE) 7 mg/24hr patch Place onto the skin.   Omega-3 1000 MG CAPS Take by mouth.   oxyCODONE (OXY IR/ROXICODONE) 5 MG immediate release tablet Take 5 mg by mouth.   No facility-administered encounter medications on file as of 10/21/2022.    Social History: Social History   Tobacco Use   Smoking status: Former    Types: Cigarettes    Quit date: 02/23/2015    Years since quitting: 7.6   Smokeless tobacco: Never    Family Medical History: History reviewed. No pertinent family history.  Physical Examination: Vitals:   10/21/22 0903  BP: 122/72    General: Patient is well developed, well nourished, calm, collected, and in no apparent distress. Attention to examination is appropriate.  Respiratory: Patient is breathing without any difficulty.   NEUROLOGICAL:     Awake, alert, oriented to person, place, and time.  Speech is clear and fluent. Fund of knowledge is appropriate.   Cranial Nerves: Pupils equal round and reactive to light.  Facial tone is symmetric.    Lumbar incision is well healed.  No lower lumbar tenderness.  Mild right buttock tenderness.   No abnormal lesions on exposed skin.   Strength: Side Biceps Triceps Deltoid Interossei Grip Wrist Ext. Wrist Flex.  R 5 5 5 5 5 5 5   L 5 5 5 5 5 5 5    Side Iliopsoas Quads Hamstring PF DF EHL  R 5 5 5 5 5 5   L 5 5 5 5 5  5  Reflexes are 2+ and symmetric at the biceps, triceps, brachioradialis, patella and achilles.   Hoffman's is absent.  Clonus is not present.   Bilateral upper and lower extremity sensation is intact to light touch.     Gait is normal.     Medical Decision Making  Imaging: None recent  Assessment and Plan: [name] is a pleasant 72 y.o. male has a history of lumbar decompression at L4-L5 in 2013. Multiple complications with this surgery with difficult  intubation, coding x 2, falling off bed, and being put in a coma. He had a lot of muscular atrophy and his pain never improved after the surgery.   He has 3-4 month history of worsening constant LBP with more constant right leg pain that runs medial to knee and then to his calf. Not having much left leg pain.   No recent lumbar imaging has been done.   Treatment options discussed with patient and following plan made:   - MRI of lumbar spine to further evaluate lumbar radiculopathy. No improvement after surgery and pain is getting worse.  - Medrol dose pack for symptom relief. He is travelling next week to visit a friend. Reviewed dosing and side effects. Take as directed.  - May consider EMG at some point due to history of muscle atrophy after surgery. I don't see that one has been done.  - Will schedule follow up visit with me to review MRI results once I get them back.   I spent a total of 40 minutes in face-to-face and non-face-to-face activities related to this patient's care today including review of outside records, review of imaging, review of symptoms, physical exam, discussion of differential diagnosis, discussion of treatment options, and documentation.   Thank you for involving me in the care of this patient.   Drake Leach PA-C Dept. of Neurosurgery

## 2022-10-21 NOTE — Patient Instructions (Addendum)
It was so nice to see you today. Thank you so much for coming in.    I want to get an MRI of your lower back to look into things further. We will get this approved through your insurance and [facility] will call you to schedule the appointment.   I sent a steroid dose pack to your pharmacy. Take as directed. No over the counter tylenol, motrin, aleve while on this medication.   Once I have the MRI results back, we will call you to make a follow up with me to review them.   Please do not hesitate to call if you have any questions or concerns. You can also message me in MyChart.   If you have not heard back about the MRI in the next week, please call the office so we can help you get it scheduled.   Drake Leach PA-C [phone]

## 2022-10-25 ENCOUNTER — Telehealth: Payer: Self-pay | Admitting: Orthopedic Surgery

## 2022-10-25 ENCOUNTER — Encounter: Payer: Self-pay | Admitting: Orthopedic Surgery

## 2022-10-25 NOTE — Telephone Encounter (Signed)
Pt called in asking to speak with SL, said the pain in right leg has gotten worse. He has gone and gotten a walker to assist him because the pain has gotten so bad. Prednisone pack he is taking but says its not really helping. Says he had some old oxycodone (from a previous surgery he thinks) that he took but its not helped either.

## 2022-10-25 NOTE — Telephone Encounter (Signed)
Has MRI been authorized yet? Please check and let him know what is going on.   Thanks.

## 2022-10-25 NOTE — Telephone Encounter (Signed)
Spoke with patient. No help in past with neurontin or muscle relaxers. He has seen some relief with percocet 10/325.   Explained that we do no prescribe narcotic pain medications unless a patient is postop and then it is only short term.   No bowel or bladder issues. No new weakness.   Advised to go to ED if pain is severe and intolerable.   Will get back with him once MRI is done.

## 2022-10-25 NOTE — Telephone Encounter (Signed)
Yes the MRI I just got approved this morning.  Patty can you please let him know that I sent a message to [facility] and also give him the number to scheduling.

## 2022-10-25 NOTE — Telephone Encounter (Signed)
Will send him a MyChart message about this.

## 2022-10-25 NOTE — Telephone Encounter (Signed)
Patient has been notified that MRI has been approved by [employer] and he was given the phone number to call.

## 2022-10-26 ENCOUNTER — Ambulatory Visit
Admission: RE | Admit: 2022-10-26 | Discharge: 2022-10-26 | Disposition: A | Discharge status: Home / Self Care | Payer: Self-pay | Source: Ambulatory Visit | Attending: Orthopedic Surgery | Admitting: Orthopedic Surgery

## 2022-10-26 ENCOUNTER — Ambulatory Visit
Admission: RE | Admit: 2022-10-26 | Discharge: 2022-10-26 | Disposition: A | Discharge status: Home / Self Care | Payer: Medicare Other | Source: Ambulatory Visit | Attending: Orthopedic Surgery | Admitting: Orthopedic Surgery

## 2022-10-26 ENCOUNTER — Other Ambulatory Visit: Payer: Self-pay

## 2022-10-26 DIAGNOSIS — M5416 Radiculopathy, lumbar region: Secondary | ICD-10-CM

## 2022-10-26 DIAGNOSIS — M5136 Other intervertebral disc degeneration, lumbar region: Secondary | ICD-10-CM | POA: Insufficient documentation

## 2022-10-26 DIAGNOSIS — M51369 Other intervertebral disc degeneration, lumbar region without mention of lumbar back pain or lower extremity pain: Secondary | ICD-10-CM

## 2022-10-26 DIAGNOSIS — D72829 Elevated white blood cell count, unspecified: Secondary | ICD-10-CM | POA: Diagnosis not present

## 2022-10-26 DIAGNOSIS — Z049 Encounter for examination and observation for unspecified reason: Secondary | ICD-10-CM

## 2022-10-26 DIAGNOSIS — M47816 Spondylosis without myelopathy or radiculopathy, lumbar region: Secondary | ICD-10-CM | POA: Insufficient documentation

## 2022-10-26 DIAGNOSIS — Z9889 Other specified postprocedural states: Secondary | ICD-10-CM | POA: Insufficient documentation

## 2022-10-27 ENCOUNTER — Encounter: Payer: Self-pay | Admitting: Orthopedic Surgery

## 2022-10-27 ENCOUNTER — Inpatient Hospital Stay
Admission: EM | Admit: 2022-10-27 | Discharge: 2022-10-30 | DRG: 981 | Disposition: A | Discharge status: Home / Self Care | Payer: Medicare Other | Attending: Internal Medicine | Admitting: Internal Medicine

## 2022-10-27 ENCOUNTER — Encounter: Payer: Self-pay | Admitting: Emergency Medicine

## 2022-10-27 ENCOUNTER — Emergency Department: Payer: Medicare Other

## 2022-10-27 ENCOUNTER — Other Ambulatory Visit: Payer: Self-pay

## 2022-10-27 DIAGNOSIS — I82491 Acute embolism and thrombosis of other specified deep vein of right lower extremity: Secondary | ICD-10-CM | POA: Diagnosis not present

## 2022-10-27 DIAGNOSIS — I2510 Atherosclerotic heart disease of native coronary artery without angina pectoris: Secondary | ICD-10-CM | POA: Diagnosis present

## 2022-10-27 DIAGNOSIS — M545 Low back pain, unspecified: Secondary | ICD-10-CM | POA: Diagnosis present

## 2022-10-27 DIAGNOSIS — D4620 Refractory anemia with excess of blasts, unspecified: Secondary | ICD-10-CM | POA: Diagnosis not present

## 2022-10-27 DIAGNOSIS — M5416 Radiculopathy, lumbar region: Secondary | ICD-10-CM | POA: Diagnosis present

## 2022-10-27 DIAGNOSIS — I10 Essential (primary) hypertension: Secondary | ICD-10-CM | POA: Diagnosis present

## 2022-10-27 DIAGNOSIS — D72829 Elevated white blood cell count, unspecified: Secondary | ICD-10-CM | POA: Diagnosis not present

## 2022-10-27 DIAGNOSIS — Z9889 Other specified postprocedural states: Secondary | ICD-10-CM

## 2022-10-27 DIAGNOSIS — X58XXXA Exposure to other specified factors, initial encounter: Secondary | ICD-10-CM | POA: Diagnosis present

## 2022-10-27 DIAGNOSIS — M47816 Spondylosis without myelopathy or radiculopathy, lumbar region: Secondary | ICD-10-CM | POA: Diagnosis present

## 2022-10-27 DIAGNOSIS — Z6824 Body mass index (BMI) 24.0-24.9, adult: Secondary | ICD-10-CM

## 2022-10-27 DIAGNOSIS — Z951 Presence of aortocoronary bypass graft: Secondary | ICD-10-CM

## 2022-10-27 DIAGNOSIS — I472 Ventricular tachycardia, unspecified: Secondary | ICD-10-CM

## 2022-10-27 DIAGNOSIS — Z7901 Long term (current) use of anticoagulants: Secondary | ICD-10-CM

## 2022-10-27 DIAGNOSIS — J189 Pneumonia, unspecified organism: Secondary | ICD-10-CM

## 2022-10-27 DIAGNOSIS — D469 Myelodysplastic syndrome, unspecified: Secondary | ICD-10-CM | POA: Diagnosis present

## 2022-10-27 DIAGNOSIS — D696 Thrombocytopenia, unspecified: Secondary | ICD-10-CM | POA: Diagnosis present

## 2022-10-27 DIAGNOSIS — I82421 Acute embolism and thrombosis of right iliac vein: Secondary | ICD-10-CM | POA: Diagnosis not present

## 2022-10-27 DIAGNOSIS — I82431 Acute embolism and thrombosis of right popliteal vein: Secondary | ICD-10-CM | POA: Diagnosis present

## 2022-10-27 DIAGNOSIS — S80811A Abrasion, right lower leg, initial encounter: Secondary | ICD-10-CM | POA: Diagnosis present

## 2022-10-27 DIAGNOSIS — R634 Abnormal weight loss: Secondary | ICD-10-CM | POA: Diagnosis present

## 2022-10-27 DIAGNOSIS — R918 Other nonspecific abnormal finding of lung field: Secondary | ICD-10-CM | POA: Diagnosis present

## 2022-10-27 DIAGNOSIS — I82411 Acute embolism and thrombosis of right femoral vein: Secondary | ICD-10-CM | POA: Diagnosis not present

## 2022-10-27 DIAGNOSIS — R911 Solitary pulmonary nodule: Secondary | ICD-10-CM

## 2022-10-27 DIAGNOSIS — N289 Disorder of kidney and ureter, unspecified: Secondary | ICD-10-CM | POA: Diagnosis present

## 2022-10-27 DIAGNOSIS — T884XXA Failed or difficult intubation, initial encounter: Secondary | ICD-10-CM

## 2022-10-27 DIAGNOSIS — Z87891 Personal history of nicotine dependence: Secondary | ICD-10-CM

## 2022-10-27 DIAGNOSIS — I82401 Acute embolism and thrombosis of unspecified deep veins of right lower extremity: Secondary | ICD-10-CM

## 2022-10-27 DIAGNOSIS — Z807 Family history of other malignant neoplasms of lymphoid, hematopoietic and related tissues: Secondary | ICD-10-CM

## 2022-10-27 DIAGNOSIS — F17200 Nicotine dependence, unspecified, uncomplicated: Secondary | ICD-10-CM | POA: Diagnosis not present

## 2022-10-27 DIAGNOSIS — E785 Hyperlipidemia, unspecified: Secondary | ICD-10-CM | POA: Diagnosis present

## 2022-10-27 DIAGNOSIS — I871 Compression of vein: Secondary | ICD-10-CM | POA: Diagnosis not present

## 2022-10-27 DIAGNOSIS — Z79899 Other long term (current) drug therapy: Secondary | ICD-10-CM

## 2022-10-27 DIAGNOSIS — I2699 Other pulmonary embolism without acute cor pulmonale: Secondary | ICD-10-CM | POA: Diagnosis present

## 2022-10-27 DIAGNOSIS — J449 Chronic obstructive pulmonary disease, unspecified: Secondary | ICD-10-CM | POA: Diagnosis present

## 2022-10-27 DIAGNOSIS — M5136 Other intervertebral disc degeneration, lumbar region: Secondary | ICD-10-CM | POA: Diagnosis present

## 2022-10-27 DIAGNOSIS — I2602 Saddle embolus of pulmonary artery with acute cor pulmonale: Secondary | ICD-10-CM | POA: Diagnosis not present

## 2022-10-27 DIAGNOSIS — R0902 Hypoxemia: Secondary | ICD-10-CM | POA: Diagnosis not present

## 2022-10-27 HISTORY — DX: Disorder of kidney and ureter, unspecified: N28.9

## 2022-10-27 HISTORY — DX: Atherosclerotic heart disease of native coronary artery without angina pectoris: I25.10

## 2022-10-27 LAB — CBC WITH DIFFERENTIAL/PLATELET
Abs Immature Granulocytes: 10.03 10*3/uL — ABNORMAL HIGH (ref 0.00–0.07)
Basophils Absolute: 1.3 10*3/uL — ABNORMAL HIGH (ref 0.0–0.1)
Basophils Relative: 2 %
Eosinophils Absolute: 0.3 10*3/uL (ref 0.0–0.5)
Eosinophils Relative: 1 %
HCT: 53.3 % — ABNORMAL HIGH (ref 39.0–52.0)
Hemoglobin: 15 g/dL (ref 13.0–17.0)
Immature Granulocytes: 19 %
Lymphocytes Relative: 4 %
Lymphs Abs: 1.9 10*3/uL (ref 0.7–4.0)
MCH: 23.7 pg — ABNORMAL LOW (ref 26.0–34.0)
MCHC: 28.1 g/dL — ABNORMAL LOW (ref 30.0–36.0)
MCV: 84.1 fL (ref 80.0–100.0)
Monocytes Absolute: 5.4 10*3/uL — ABNORMAL HIGH (ref 0.1–1.0)
Monocytes Relative: 10 %
Neutro Abs: 33.6 10*3/uL — ABNORMAL HIGH (ref 1.7–7.7)
Neutrophils Relative %: 64 %
Platelets: 94 10*3/uL — ABNORMAL LOW (ref 150–400)
RBC: 6.34 MIL/uL — ABNORMAL HIGH (ref 4.22–5.81)
RDW: 19.9 % — ABNORMAL HIGH (ref 11.5–15.5)
Smear Review: NORMAL
WBC: 52.5 10*3/uL (ref 4.0–10.5)
nRBC: 3 % — ABNORMAL HIGH (ref 0.0–0.2)

## 2022-10-27 LAB — BASIC METABOLIC PANEL
Anion gap: 12 (ref 5–15)
BUN: 22 mg/dL (ref 8–23)
CO2: 21 mmol/L — ABNORMAL LOW (ref 22–32)
Calcium: 8.7 mg/dL — ABNORMAL LOW (ref 8.9–10.3)
Chloride: 105 mmol/L (ref 98–111)
Creatinine, Ser: 1.37 mg/dL — ABNORMAL HIGH (ref 0.61–1.24)
GFR, Estimated: 55 mL/min — ABNORMAL LOW (ref >60)
Glucose, Bld: 120 mg/dL — ABNORMAL HIGH (ref 70–99)
Potassium: 4.2 mmol/L (ref 3.5–5.1)
Sodium: 138 mmol/L (ref 135–145)

## 2022-10-27 LAB — HEPARIN LEVEL (UNFRACTIONATED): Heparin Unfractionated: 0.13 IU/mL — ABNORMAL LOW (ref 0.30–0.70)

## 2022-10-27 LAB — PROTIME-INR
INR: 1.2 (ref 0.8–1.2)
Prothrombin Time: 15.4 seconds — ABNORMAL HIGH (ref 11.4–15.2)

## 2022-10-27 LAB — PATHOLOGIST SMEAR REVIEW

## 2022-10-27 LAB — APTT: aPTT: 37 seconds — ABNORMAL HIGH (ref 24–36)

## 2022-10-27 LAB — CK: Total CK: 24 U/L — ABNORMAL LOW (ref 49–397)

## 2022-10-27 MED ORDER — ALBUTEROL SULFATE (2.5 MG/3ML) 0.083% IN NEBU: 2.5000 mg | INHALATION_SOLUTION | Freq: Four times a day (QID) | RESPIRATORY_TRACT | Status: DC | PRN

## 2022-10-27 MED ORDER — ACETAMINOPHEN 325 MG PO TABS
650.0000 mg | ORAL_TABLET | Freq: Four times a day (QID) | ORAL | Status: DC | PRN
  Administered 2022-10-29: 650 mg via ORAL
  Filled 2022-10-27: qty 2

## 2022-10-27 MED ORDER — MORPHINE SULFATE (PF) 4 MG/ML IV SOLN: 4.0000 mg | INTRAVENOUS | Status: DC | PRN

## 2022-10-27 MED ORDER — NICOTINE POLACRILEX 2 MG MT GUM: 2.0000 mg | CHEWING_GUM | OROMUCOSAL | Status: DC | PRN

## 2022-10-27 MED ORDER — ACETAMINOPHEN 650 MG RE SUPP: 650.0000 mg | Freq: Four times a day (QID) | RECTAL | Status: DC | PRN

## 2022-10-27 MED ORDER — MORPHINE SULFATE (PF) 4 MG/ML IV SOLN
4.0000 mg | Freq: Once | INTRAVENOUS | Status: AC
  Administered 2022-10-27: 4 mg via INTRAVENOUS
  Filled 2022-10-27: qty 1

## 2022-10-27 MED ORDER — HEPARIN BOLUS VIA INFUSION
2300.0000 [IU] | Freq: Once | INTRAVENOUS | Status: AC
  Administered 2022-10-27: 2300 [IU] via INTRAVENOUS
  Filled 2022-10-27: qty 2300

## 2022-10-27 MED ORDER — MELATONIN 5 MG PO TABS: 5.0000 mg | ORAL_TABLET | Freq: Every evening | ORAL | Status: DC | PRN

## 2022-10-27 MED ORDER — MORPHINE SULFATE (PF) 2 MG/ML IV SOLN: 2.0000 mg | INTRAVENOUS | Status: DC | PRN

## 2022-10-27 MED ORDER — ONDANSETRON HCL 4 MG PO TABS: 4.0000 mg | ORAL_TABLET | Freq: Four times a day (QID) | ORAL | Status: DC | PRN

## 2022-10-27 MED ORDER — HYDRALAZINE HCL 20 MG/ML IJ SOLN: 5.0000 mg | Freq: Three times a day (TID) | INTRAMUSCULAR | Status: DC | PRN

## 2022-10-27 MED ORDER — HEPARIN (PORCINE) 25000 UT/250ML-% IV SOLN
1700.0000 [IU]/h | INTRAVENOUS | Status: DC
  Administered 2022-10-27: 1250 [IU]/h via INTRAVENOUS
  Administered 2022-10-28: 1500 [IU]/h via INTRAVENOUS
  Filled 2022-10-27 (×2): qty 250

## 2022-10-27 MED ORDER — SODIUM CHLORIDE 0.9 % IV BOLUS
1000.0000 mL | Freq: Once | INTRAVENOUS | Status: AC
  Administered 2022-10-27: 1000 mL via INTRAVENOUS

## 2022-10-27 MED ORDER — NICOTINE 21 MG/24HR TD PT24: 21.0000 mg | MEDICATED_PATCH | Freq: Every day | TRANSDERMAL | Status: DC | PRN

## 2022-10-27 MED ORDER — CHLORHEXIDINE GLUCONATE 4 % EX SOLN
60.0000 mL | Freq: Once | CUTANEOUS | Status: AC
  Administered 2022-10-28: 4 via TOPICAL

## 2022-10-27 MED ORDER — METOPROLOL TARTRATE 25 MG PO TABS
25.0000 mg | ORAL_TABLET | Freq: Two times a day (BID) | ORAL | Status: DC
  Administered 2022-10-27 – 2022-10-28 (×3): 25 mg via ORAL
  Filled 2022-10-27 (×3): qty 1

## 2022-10-27 MED ORDER — ONDANSETRON HCL 4 MG/2ML IJ SOLN: 4.0000 mg | Freq: Four times a day (QID) | INTRAMUSCULAR | Status: DC | PRN

## 2022-10-27 MED ORDER — HYDROMORPHONE HCL 1 MG/ML IJ SOLN
0.5000 mg | INTRAMUSCULAR | Status: AC | PRN
  Administered 2022-10-27 – 2022-10-28 (×3): 0.5 mg via INTRAVENOUS
  Filled 2022-10-27 (×3): qty 1

## 2022-10-27 MED ORDER — ATORVASTATIN CALCIUM 20 MG PO TABS
80.0000 mg | ORAL_TABLET | Freq: Every day | ORAL | Status: DC
  Administered 2022-10-27 – 2022-10-29 (×3): 80 mg via ORAL
  Filled 2022-10-27 (×3): qty 4

## 2022-10-27 MED ORDER — OXYCODONE HCL 5 MG PO TABS
5.0000 mg | ORAL_TABLET | Freq: Four times a day (QID) | ORAL | Status: DC | PRN
  Administered 2022-10-27 – 2022-10-28 (×2): 5 mg via ORAL
  Filled 2022-10-27 (×2): qty 1

## 2022-10-27 MED ORDER — MORPHINE SULFATE (PF) 4 MG/ML IV SOLN
4.0000 mg | INTRAVENOUS | Status: DC | PRN
  Administered 2022-10-28 (×2): 4 mg via INTRAVENOUS
  Filled 2022-10-27 (×2): qty 1

## 2022-10-27 MED ORDER — HEPARIN BOLUS VIA INFUSION
5000.0000 [IU] | Freq: Once | INTRAVENOUS | Status: AC
  Administered 2022-10-27: 5000 [IU] via INTRAVENOUS
  Filled 2022-10-27: qty 5000

## 2022-10-27 MED ORDER — IOHEXOL 300 MG/ML  SOLN
100.0000 mL | Freq: Once | INTRAMUSCULAR | Status: AC | PRN
  Administered 2022-10-27: 100 mL via INTRAVENOUS

## 2022-10-27 MED ORDER — HYDROMORPHONE HCL 1 MG/ML IJ SOLN
1.0000 mg | Freq: Once | INTRAMUSCULAR | Status: AC
  Administered 2022-10-27: 1 mg via INTRAVENOUS
  Filled 2022-10-27: qty 1

## 2022-10-27 MED ORDER — SODIUM CHLORIDE 0.9 % IV SOLN: INTRAVENOUS | Status: AC

## 2022-10-27 MED ORDER — SENNOSIDES-DOCUSATE SODIUM 8.6-50 MG PO TABS
1.0000 | ORAL_TABLET | Freq: Every evening | ORAL | Status: DC | PRN
  Administered 2022-10-29 (×2): 1 via ORAL
  Filled 2022-10-27 (×2): qty 1

## 2022-10-27 MED ORDER — HYDROMORPHONE HCL 1 MG/ML IJ SOLN
1.0000 mg | INTRAMUSCULAR | Status: DC | PRN
  Administered 2022-10-27: 1 mg via INTRAVENOUS
  Filled 2022-10-27: qty 1

## 2022-10-27 NOTE — Consult Note (Signed)
Hospital Consult    Reason for Consult:  Right Lower extremity DVT Requesting Physician:  Dr Londell Moh MD MRN #:  [id number]  History of Present Illness: This is a 72 y.o. male with PMHx significant for back surgery L4-L5 in 2013 and CAD s/p CABG. Patient presents to ED with complaints of low back pain and right lower extremity pain for months and progressive swelling. MR and CT imaging done without acute findings, right lower extremity venous ultrasound done revealed extensive occlusive acute DVT in major deep veins. Patient endorses pain to his right lower extremity which started about 5 weeks ago. He states he felt something "pop". Pain is on exertion and at rest. Patient also had lab work which was abnormal and showing blast cells, request received for bone marrow biopsy.    The patient denies any current chest pain or shortness of breath. He does state he has had some occasional lightheadedness but no syncope. The patient denies any history of sleep apnea or chronic oxygen use. He has no known complications to sedation. No other complaints. Vitals all remain stable.     Past Medical History:  Diagnosis Date   Coronary artery disease    Renal disorder     Past Surgical History:  Procedure Laterality Date   BACK SURGERY     CARDIAC SURGERY     KIDNEY SURGERY      No Known Allergies  Prior to Admission medications   Medication Sig Start Date End Date Taking? Authorizing Provider  atorvastatin (LIPITOR) 80 MG tablet Take 80 mg by mouth. 03/12/15 10/27/22 Yes [provider]  metoprolol tartrate (LOPRESSOR) 25 MG tablet Take 25 mg by mouth 2 (two) times daily. 03/12/15 10/27/22 Yes [provider]  albuterol (VENTOLIN HFA) 108 (90 Base) MCG/ACT inhaler Inhale 2 puffs into the lungs every 4 (four) hours as needed for shortness of breath or wheezing. Patient not taking: Reported on 10/27/2022 08/12/22   [provider]  diclofenac Sodium (VOLTAREN) 1 % GEL Apply  4 g topically 4 (four) times daily as needed (pain). Patient not taking: Reported on 10/27/2022 10/13/22   [provider]  Glucosamine Sulfate 500 MG TABS Take by mouth.    [provider]  methylPREDNISolone (MEDROL DOSEPAK) 4 MG TBPK tablet Use as directed x 6 days. Patient not taking: Reported on 10/27/2022 10/21/22   Drake Leach, PA-C  Multiple Vitamin (MULTI-VITAMINS) TABS Take by mouth. Patient not taking: Reported on 10/27/2022    [provider]  nicotine (RA NICOTINE) 7 mg/24hr patch Place onto the skin. 03/12/15   [provider]  Omega-3 1000 MG CAPS Take by mouth.    [provider]  oxyCODONE (OXY IR/ROXICODONE) 5 MG immediate release tablet Take 5 mg by mouth. 03/12/15   [provider]  tiZANidine (ZANAFLEX) 4 MG tablet Take 4 mg by mouth every 8 (eight) hours as needed for muscle spasms. Patient not taking: Reported on 10/27/2022 10/23/22   [provider]  TRELEGY ELLIPTA 100-62.5-25 MCG/ACT AEPB Inhale 1 puff into the lungs daily. Patient not taking: Reported on 10/27/2022    [provider]    Social History   Socioeconomic History   Marital status: Married    Spouse name: Not on file   Number of children: Not on file   Years of education: Not on file   Highest education level: Not on file  Occupational History   Not on file  Tobacco Use   Smoking status: Former  Types: Cigarettes    Quit date: 02/23/2015    Years since quitting: 7.6   Smokeless tobacco: Never  Substance and Sexual Activity   Alcohol use: Not Currently   Drug use: Never   Sexual activity: Yes  Other Topics Concern   Not on file  Social History Narrative   Not on file   Social Determinants of Health   Financial Resource Strain: Not on file  Food Insecurity: No Food Insecurity (10/27/2022)   Hunger Vital Sign    Worried About Running Out of Food in the Last Year: Never true    Ran Out of Food in the Last Year: Never true   Transportation Needs: No Transportation Needs (10/27/2022)   PRAPARE - Administrator, Civil Service (Medical): No    Lack of Transportation (Non-Medical): No  Physical Activity: Not on file  Stress: Not on file  Social Connections: Not on file  Intimate Partner Violence: Not At Risk (10/27/2022)   Humiliation, Afraid, Rape, and Kick questionnaire    Fear of Current or Ex-Partner: No    Emotionally Abused: No    Physically Abused: No    Sexually Abused: No     Family History  Problem Relation Age of Onset   Non-Hodgkin's lymphoma Brother     ROS: Otherwise negative unless mentioned in HPI  Physical Examination  Vitals:   10/27/22 1336 10/27/22 1415  BP: (!) 140/88 (!) 147/82  Pulse: 80 78  Resp: 18 20  Temp:  97.8 F (36.6 C)  SpO2: 96% 92%   Body mass index is 24.36 kg/m.  General:  WDWN in NAD Gait: Not observed HENT: WNL, normocephalic Pulmonary: normal non-labored breathing, without Rales, rhonchi,  wheezing Cardiac: regular, without  Murmurs, rubs or gallops; without carotid bruits Abdomen: Positive bowel sounds, soft, NT/ND, no masses Skin: without rashes Vascular Exam/Pulses: Palpable pulses throughout. Bilateral lower extremities are warm to touch. Extremities: without ischemic changes, without Gangrene , without cellulitis; without open wounds;  Musculoskeletal: no muscle wasting or atrophy  Neurologic: A&O X 3;  No focal weakness or paresthesias are detected; speech is fluent/normal Psychiatric:  The pt has Normal affect. Lymph:  Unremarkable  CBC    Component Value Date/Time   WBC 52.5 (HH) 10/27/2022 0857   RBC 6.34 (H) 10/27/2022 0857   HGB 15.0 10/27/2022 0857   HGB 11.3 (L) 08/25/2011 0301   HCT 53.3 (H) 10/27/2022 0857   HCT 33.7 (L) 08/25/2011 0301   PLT 94 (L) 10/27/2022 0857   PLT 176 08/25/2011 0301   MCV 84.1 10/27/2022 0857   MCV 95 08/25/2011 0301   MCH 23.7 (L) 10/27/2022 0857   MCHC 28.1 (L) 10/27/2022 0857   RDW  19.9 (H) 10/27/2022 0857   RDW 14.5 08/25/2011 0301   LYMPHSABS 1.9 10/27/2022 0857   LYMPHSABS 0.3 (L) 08/25/2011 0301   MONOABS 5.4 (H) 10/27/2022 0857   MONOABS 0.8 (H) 08/25/2011 0301   EOSABS 0.3 10/27/2022 0857   EOSABS 0.0 08/25/2011 0301   BASOSABS 1.3 (H) 10/27/2022 0857   BASOSABS 0.0 08/25/2011 0301    BMET    Component Value Date/Time   NA 138 10/27/2022 0857   NA 144 08/27/2011 0438   K 4.2 10/27/2022 0857   K 3.3 (L) 08/27/2011 0438   CL 105 10/27/2022 0857   CL 107 08/27/2011 0438   CO2 21 (L) 10/27/2022 0857   CO2 28 08/27/2011 0438   GLUCOSE 120 (H) 10/27/2022 0857   GLUCOSE 110 (H)  08/27/2011 0438   BUN 22 10/27/2022 0857   BUN 16 08/27/2011 0438   CREATININE 1.37 (H) 10/27/2022 0857   CREATININE 0.89 08/27/2011 0438   CALCIUM 8.7 (L) 10/27/2022 0857   CALCIUM 8.2 (L) 08/27/2011 0438   GFRNONAA 55 (L) 10/27/2022 0857   GFRNONAA >60 08/27/2011 0438   GFRAA >60 08/27/2011 0438    COAGS: Lab Results  Component Value Date   INR 1.2 10/27/2022   INR 1.1 08/22/2011     Non-Invasive Vascular Imaging:   EXAM: 10/27/2022 Right LOWER EXTREMITY VENOUS DOPPLER ULTRASOUND  FINDINGS: VENOUS   Extensive acute occlusive DVT is noted in right lower extremity involving common femoral, femoral, popliteal, posterior tibial and peroneal veins.   Limited views of the contralateral common femoral vein are unremarkable.   OTHER   None.   Limitations: none   IMPRESSION: Extensive occlusive acute DVT is noted in major deep veins in right lower extremity.  EXAM: 10/27/2022 CT OF THE LOWER RIGHT EXTREMITY WITH CONTRAST   TECHNIQUE: Multidetector CT imaging of the right lower leg was performed according to the standard protocol following intravenous contrast administration.   RADIATION DOSE REDUCTION: This exam was performed according to the departmental dose-optimization program which includes automated exposure control, adjustment of the mA and/or kV  according to patient size and/or use of iterative reconstruction technique.   CONTRAST:  OMNIPAQUE IOHEXOL 300 MG/ML  SOLN   COMPARISON:  None Available.   FINDINGS: Bones/Joint/Cartilage   No evidence of acute fracture or dislocation. The right tibia and fibula appear normal. No significant arthropathy or effusion identified at the right knee or ankle.   Ligaments   Suboptimally assessed by CT.   Muscles and Tendons   No muscular abnormalities are identified in the lower leg. Specifically, no evidence of intramuscular fluid collection, atrophy or suspicious enhancement. The patellar tendon appears normal. The quadriceps tendon is incompletely visualized, although appears intact. The ankle tendons appear intact.   Soft tissues   This study was performed with intravenous contrast during the arterial phase of contrast administration. There is popliteal and runoff atherosclerosis without evidence of large vessel arterial occlusion. No significant venous opacification is demonstrated. The popliteal vein appears distended with central low-density, findings which are suspicious for deep venous thrombosis. No other significant soft tissue abnormalities are identified.   IMPRESSION: 1. No acute osseous findings or significant arthropathy identified in the right lower leg. 2. No evidence of intramuscular fluid collection, atrophy or suspicious enhancement. 3. The popliteal vein appears distended with central low-density, suspicious for deep venous thrombosis. This is suboptimally evaluated by this examination performed during the arterial phase of contrast administration. Patient is already scheduled for venous doppler ultrasound today. 4. Atherosclerosis without evidence of large vessel arterial occlusion.  Statin:  Yes.   Beta Blocker:  Yes.   Aspirin:  No. ACEI:  No. ARB:  No. CCB use:  No Other antiplatelets/anticoagulants:  No.    ASSESSMENT/PLAN: This is a  72 y.o. male This is a 72 y.o. male with PMHx significant for back surgery L4-L5 in 2013 and CAD s/p CABG. Patient presents to ED with complaints of low back pain and right lower extremity pain for months and progressive swelling. MR and CT imaging done without acute findings, right lower extremity venous ultrasound done revealed extensive occlusive acute DVT in major deep veins.   PLAN: Vascular surgery plans on taking the patient to the vascular lab Friday, 10/28/2022 for a right lower extremity Peripheral Vascular Thrombectomy with  possible intervention.  I discussed in detail with the patient the procedure, benefits, risks, and complications.  Patient verbalizes understanding.  I answered all the patient's questions this afternoon.  Patient would like to proceed as soon as possible. He will be made n.p.o. after midnight.   -Discussed the plan in detail with Dr. Festus Barren MD and he agrees with the plan.   Marcie [facility] 10/27/2022 4:21 PM

## 2022-10-27 NOTE — Progress Notes (Signed)
MRI of lumbar spine dated 10/26/22:  FINDINGS: Segmentation: Transitional lumbosacral anatomy. In keeping with the numbering applied to the previous study, there is a transitional, nearly fully sacralized L5 segment.   Alignment: Mild convex left scoliosis. Minimal retrolisthesis at L4-5.   Vertebrae: No worrisome osseous lesion, acute fracture or pars defect. Chronic endplate degenerative changes at L3-4 and L4-5.   Conus medullaris: Extends to the L1 level and appears normal.   Paraspinal and other soft tissues: No significant paraspinal findings. 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 A chronic peripherally calcified collection anterolateral to the right psoas muscle appears unchanged. There is a heterogeneous sebaceous cyst along the posterior left lower back which appears unchanged.   Disc levels:   Sagittal images demonstrate no significant disc space findings within the visualized lower thoracic spine.   L1-2: Preserved disc height. There is mild disc bulging with a shallow left foraminal disc protrusion. Mild left foraminal narrowing without definite L1 nerve root encroachment.   L2-3: Mild loss of disc height with mild disc bulging, facet and ligamentous hypertrophy. No significant spinal stenosis or nerve root encroachment.   L3-4: Chronic loss of disc height with annular disc bulging and endplate osteophytes asymmetric to the right. Mild facet and ligamentous hypertrophy. The spinal canal and lateral recesses are patent. Mild right foraminal narrowing appears stable.   L4-5: Remote right hemilaminectomy. Chronic loss of disc height with annular disc bulging and endplate osteophytes. Mild bilateral facet hypertrophy. Stable mild scarring in the right lateral recess. The spinal canal and left lateral recess are widely patent. Unchanged mild foraminal narrowing bilaterally.   L5-S1: As numbered, this  disc space is transitional without acquired abnormality.   IMPRESSION: 1. Transitional lumbosacral anatomy. In keeping with the numbering applied to the previous MRI, the transitional segment is nearly fully sacralized at L5. 2. No acute findings or clear explanation for the patient's symptoms. 3. Stable postsurgical changes on the right at L4-5 with mild scarring in the right lateral recess and mild foraminal narrowing bilaterally. 4. Stable mild right foraminal narrowing at L3-4 due to asymmetric disc bulging and endplate osteophytes. 5. Stable chronic peripherally calcified collection anterolateral to the right psoas muscle.     Electronically Signed   By: Carey Bullocks M.D.   On: 10/27/2022 11:52  I have personally reviewed the images and agree with the above interpretation.  Patient admitted today with extensive right LE DVT and this was likely cause of severe leg pain. Also found to have leukocytosis with 10% blasts. Scheduled for bone marrow biopsy tomorrow.   Spoke with patient this evening to check on him. Briefly reviewed MRI results and that I thought right leg pain was from DVT. Discussed calcified area near right psoas. Appears unchanged from MRI in 2018.   Will review with Dr. Myer Haff and message him. Otherwise, he will call me to follow up in office when he is ready.

## 2022-10-27 NOTE — H&P (Signed)
History and Physical   Witten Stenerson XBJ:[id number] DOB: May 19, 1951 DOA: 10/27/2022  PCP: Pcp, No  Patient coming from: Home  I have personally briefly reviewed patient's old medical records in [facility] EMR.  Chief Concern: Right leg pain and swelling  HPI: [name] is a 72 year old male with history of hyperlipidemia, tobacco use, hypertension, who presents to emergency department for chief concerns of right leg pain and swelling for 1 month.  Vitals in the ED showed temperature of 97.8, respiration rate of 20, heart rate 94, blood pressure 162/101, SpO2 of 95% on room air.  Serum sodium is 138, potassium 4.2, chloride 105, bicarb 21, BUN of 22, serum creatinine of 1.37, nonfasting blood glucose 120, eGFR 55, WBC 52.5, hemoglobin 15, platelets of 94.  CK 24.  ED treatment: Morphine 4 mg IV one-time dose, Dilaudid 1 mg IV. --------------------------------- At bedside, he is able to tell me his name, age, current year, current location.   He reports that about 5 weeks ago he was walking and felt like he pulled a tendon in his right leg.  He developed swelling and tenderness behind his knee at that time.  He reports Two weeks ago he went to [place], and drove for two hours and then stopped for gas.  He finish an additional 2-hour trip to get to [place] which was a total of 4 hours.  He reports that after the trip his right leg pain and swelling worsened.  He also notes increased warmth of his right lower leg along with calf tenderness.  He denies recent fever and/or chills. He has baseline cough, non productive.  He denies trauma to his person.  He denies chest pain, chest pressure, chest tightness, shortness of breath, dysuria, hematuria, diarrhea.  Patient reports that in November 2023, he developed pneumonia bilaterally.  He lost significant amount of weight (unintentional) from November to January.  He reports his weight has stabilized at this  time.  Social history: He lives at home with his spouse. He is a former tobacco use, quitting November 2023. At the most, he smoked 1/2 pack per day. He denies IV drug use. He is retire and formerly worked for [employer].   ROS: Constitutional: + weight change, no fever ENT/Mouth: no sore throat, no rhinorrhea Eyes: no eye pain, no vision changes Cardiovascular: no chest pain, no dyspnea, no palpitations Respiratory: no cough, no sputum, no wheezing Gastrointestinal: no nausea, no vomiting, no diarrhea, no constipation Genitourinary: no urinary incontinence, no dysuria, no hematuria Musculoskeletal: no arthralgias, no myalgias, right leg pain + swelling Skin: no skin lesions, no pruritus, + right leg warmth Neuro: + weakness, no loss of consciousness, no syncope Psych: no anxiety, no depression, no decrease appetite Heme/Lymph: no bruising, no bleeding  ED Course: Discussed with emergency medicine provider, patient requiring hospitalization for chief concerns of right leg DVT.  Assessment/Plan  Principal Problem:   Leukocytosis Active Problems:   Right leg DVT (HCC)   H/O coronary artery bypass surgery   Current smoker   Essential hypertension   Difficult airway for intubation   Assessment and Plan:  * Leukocytosis WBC markedly elevated at 52.5 on admission, query leukemia Sodium chloride 1 L bolus ordered Sodium chloride 150 mL/h, 1 day ordered Etiology workup in progress at the time Hematology has been consulted by EDP and by epic order N.p.o. after midnight Admit to telemetry medical, inpatient  Right leg DVT (HCC) Extensive occlusive acute DVT in major deep veins in the  right lower extremity Heparin per pharmacy has been ordered Vascular surgery specialist has been consulted via secure chat and Epic order Vascular surgery aware of patient and tentatively plan for thrombectomy and IVC filter placement on 10/28/2022  Difficult airway for intubation Patient has history of  difficult airway for intubation Patient states that he will require glide scope for any intubation  Essential hypertension Metoprolol tartrate 25 mg p.o. twice daily Hydralazine 5 mg IV every 8 hours as needed for SBP greater 175, 4 days ordered  Current smoker Former tobacco user, patient quit in November 2023  H/O coronary artery bypass surgery Home metoprolol tartrate 25 mg p.o. twice daily resumed, atorvastatin 80 mg nightly resumed  Chart reviewed.   DVT prophylaxis: Heparin per pharmacy Code Status: full code  Diet: Heart healthy diet; n.p.o. after midnight Family Communication: Updated his spouse at bedside.  Disposition Plan: Pending clinical course, bone marrow biopsy Consults called: Hematology service, vascular service Admission status: Telemetry medical, inpatient  Past Medical History:  Diagnosis Date   Coronary artery disease    Renal disorder    Past Surgical History:  Procedure Laterality Date   BACK SURGERY     CARDIAC SURGERY     KIDNEY SURGERY     Social History:  reports that he quit smoking about 7 years ago. His smoking use included cigarettes. He has never used smokeless tobacco. He reports that he does not currently use alcohol. He reports that he does not use drugs.  No Known Allergies Family History  Problem Relation Age of Onset   Non-Hodgkin's lymphoma Brother    Family history: Family history reviewed and not pertinent.  Prior to Admission medications   Medication Sig Start Date End Date Taking? Authorizing Provider  atorvastatin (LIPITOR) 80 MG tablet Take 80 mg by mouth. 03/12/15 04/11/15  [provider]  Glucosamine Sulfate 500 MG TABS Take by mouth.    [provider]  methylPREDNISolone (MEDROL DOSEPAK) 4 MG TBPK tablet Use as directed x 6 days. 10/21/22   Drake Leach, PA-C  metoprolol tartrate (LOPRESSOR) 25 MG tablet Take 25 mg by mouth. 03/12/15 03/11/16  [provider]  Multiple Vitamin (MULTI-VITAMINS)  TABS Take by mouth.    [provider]  nicotine (RA NICOTINE) 7 mg/24hr patch Place onto the skin. 03/12/15   [provider]  Omega-3 1000 MG CAPS Take by mouth.    [provider]  oxyCODONE (OXY IR/ROXICODONE) 5 MG immediate release tablet Take 5 mg by mouth. 03/12/15   [provider]   Physical Exam: Vitals:   10/27/22 1253 10/27/22 1336 10/27/22 1415 10/27/22 1630  BP: (!) 148/90 (!) 140/88 (!) 147/82 108/68  Pulse: 88 80 78 92  Resp: 20 18 20 18   Temp: 98 F (36.7 C)  97.8 F (36.6 C) 98.2 F (36.8 C)  TempSrc: Oral   Oral  SpO2: 96% 96% 92% 91%  Weight:      Height:       Constitutional: appears age-appropriate, NAD, calm Eyes: PERRL, lids and conjunctivae normal ENMT: Mucous membranes are moist. Posterior pharynx clear of any exudate or lesions. Age-appropriate dentition. Hearing appropriate Neck: normal, supple, no masses, no thyromegaly Respiratory: clear to auscultation bilaterally, no wheezing, no crackles. Normal respiratory effort. No accessory muscle use.  Cardiovascular: Regular rate and rhythm, no murmurs / rubs / gallops. + right leg extremity swelling, 2+ pedal pulses. No carotid bruits.  Abdomen: no tenderness, no masses palpated, no hepatosplenomegaly. Bowel sounds positive.  Musculoskeletal:  no clubbing / cyanosis. No joint deformity upper and lower extremities. Good ROM, no contractures, no atrophy. Normal muscle tone.  Skin: no rashes, lesions, ulcers. No induration, + right leg warmth (no redness).  Neurologic: Sensation intact. Strength 5/5 in all 4.  Psychiatric: Normal judgment and insight. Alert and oriented x 3. Normal mood.   EKG: independently reviewed, showing sinus rhythm with rate of 91, QTc 450  Chest x-ray on Admission: I personally reviewed and I agree with radiologist reading as below.  US Venous Img Lower Right (DVT Study)  Result Date: 10/27/2022 CLINICAL DATA:  Calf pain EXAM: Right LOWER EXTREMITY  VENOUS DOPPLER ULTRASOUND TECHNIQUE: Gray-scale sonography with compression, as well as color and duplex ultrasound, were performed to evaluate the deep venous system(s) from the level of the common femoral vein through the popliteal and proximal calf veins. COMPARISON:  None Available. FINDINGS: VENOUS Extensive acute occlusive DVT is noted in right lower extremity involving common femoral, femoral, popliteal, posterior tibial and peroneal veins. Limited views of the contralateral common femoral vein are unremarkable. OTHER None. Limitations: none IMPRESSION: Extensive occlusive acute DVT is noted in major deep veins in right lower extremity. Electronically Signed   By: Ernie Avena M.D.   On: 10/27/2022 12:56   MR LUMBAR SPINE WO CONTRAST  Result Date: 10/27/2022 CLINICAL DATA:  Low back and right leg pain.  Remote back surgery. EXAM: MRI LUMBAR SPINE WITHOUT CONTRAST TECHNIQUE: Multiplanar, multisequence MR imaging of the lumbar spine was performed. No intravenous contrast was administered. COMPARISON:  Lumbar spine radiographs 09/12/2016. Lumbar [facility] from [facility] 10/03/2016. Abdominopelvic CT 12/13/2011. FINDINGS: Segmentation: Transitional lumbosacral anatomy. In keeping with the numbering applied to the previous study, there is a transitional, nearly fully sacralized L5 segment. Alignment: Mild convex left scoliosis. Minimal retrolisthesis at L4-5. Vertebrae: No worrisome osseous lesion, acute fracture or pars defect. Chronic endplate degenerative changes at L3-4 and L4-5. Conus medullaris: Extends to the L1 level and appears normal. Paraspinal and other soft tissues: No significant paraspinal findings. 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 A chronic peripherally calcified collection anterolateral to the right psoas muscle appears unchanged. There is a heterogeneous sebaceous cyst along the posterior left lower back which appears  unchanged. Disc levels: Sagittal images demonstrate no significant disc space findings within the visualized lower thoracic spine. L1-2: Preserved disc height. There is mild disc bulging with a shallow left foraminal disc protrusion. Mild left foraminal narrowing without definite L1 nerve root encroachment. L2-3: Mild loss of disc height with mild disc bulging, facet and ligamentous hypertrophy. No significant spinal stenosis or nerve root encroachment. L3-4: Chronic loss of disc height with annular disc bulging and endplate osteophytes asymmetric to the right. Mild facet and ligamentous hypertrophy. The spinal canal and lateral recesses are patent. Mild right foraminal narrowing appears stable. L4-5: Remote right hemilaminectomy. Chronic loss of disc height with annular disc bulging and endplate osteophytes. Mild bilateral facet hypertrophy. Stable mild scarring in the right lateral recess. The spinal canal and left lateral recess are widely patent. Unchanged mild foraminal narrowing bilaterally. L5-S1: As numbered, this disc space is transitional without acquired abnormality. IMPRESSION: 1. Transitional lumbosacral anatomy. In keeping with the numbering applied to the previous MRI, the transitional segment is nearly fully sacralized at L5. 2. No acute findings or clear explanation for the patient's symptoms. 3. Stable postsurgical changes on the right at L4-5 with mild scarring in the right lateral recess and mild foraminal narrowing bilaterally.  4. Stable mild right foraminal narrowing at L3-4 due to asymmetric disc bulging and endplate osteophytes. 5. Stable chronic peripherally calcified collection anterolateral to the right psoas muscle. Electronically Signed   By: Carey Bullocks M.D.   On: 10/27/2022 11:52   CT TIBIA FIBULA RIGHT W CONTRAST  Result Date: 10/27/2022 CLINICAL DATA:  Soft tissue infection suspected, lower leg. Right posterior leg pain and swelling for 1 month. EXAM: CT OF THE LOWER RIGHT  EXTREMITY WITH CONTRAST TECHNIQUE: Multidetector CT imaging of the right lower leg was performed according to the standard protocol following intravenous contrast administration. RADIATION DOSE REDUCTION: This exam was performed according to the departmental dose-optimization program which includes automated exposure control, adjustment of the mA and/or kV according to patient size and/or use of iterative reconstruction technique. CONTRAST:  OMNIPAQUE IOHEXOL 300 MG/ML  SOLN COMPARISON:  None Available. FINDINGS: Bones/Joint/Cartilage No evidence of acute fracture or dislocation. The right tibia and fibula appear normal. No significant arthropathy or effusion identified at the right knee or ankle. Ligaments Suboptimally assessed by CT. Muscles and Tendons No muscular abnormalities are identified in the lower leg. Specifically, no evidence of intramuscular fluid collection, atrophy or suspicious enhancement. The patellar tendon appears normal. The quadriceps tendon is incompletely visualized, although appears intact. The ankle tendons appear intact. Soft tissues This study was performed with intravenous contrast during the arterial phase of contrast administration. There is popliteal and runoff atherosclerosis without evidence of large vessel arterial occlusion. No significant venous opacification is demonstrated. The popliteal vein appears distended with central low-density, findings which are suspicious for deep venous thrombosis. No other significant soft tissue abnormalities are identified. IMPRESSION: 1. No acute osseous findings or significant arthropathy identified in the right lower leg. 2. No evidence of intramuscular fluid collection, atrophy or suspicious enhancement. 3. The popliteal vein appears distended with central low-density, suspicious for deep venous thrombosis. This is suboptimally evaluated by this examination performed during the arterial phase of contrast administration. Patient is  already scheduled for venous doppler ultrasound today. 4. Atherosclerosis without evidence of large vessel arterial occlusion. Electronically Signed   By: Carey Bullocks M.D.   On: 10/27/2022 11:39    Labs on Admission: I have personally reviewed following labs  CBC: Recent Labs  Lab 10/27/22 0857  WBC 52.5*  NEUTROABS 33.6*  HGB 15.0  HCT 53.3*  MCV 84.1  PLT 94*   Basic Metabolic Panel: Recent Labs  Lab 10/27/22 0857  NA 138  K 4.2  CL 105  CO2 21*  GLUCOSE 120*  BUN 22  CREATININE 1.37*  CALCIUM 8.7*   GFR: Estimated Creatinine Clearance: 51.8 mL/min (A) (by C-G formula based on SCr of 1.37 mg/dL (H)).  Cardiac Enzymes: Recent Labs  Lab 10/27/22 0857  CKTOTAL 24*   Urine analysis:    Component Value Date/Time   COLORURINE Yellow 08/19/2011 1436   APPEARANCEUR Clear 08/19/2011 1436   LABSPEC 1.016 08/19/2011 1436   PHURINE 5.0 08/19/2011 1436   GLUCOSEU Negative 08/19/2011 1436   HGBUR 2+ 08/19/2011 1436   BILIRUBINUR Negative 08/19/2011 1436   KETONESUR Negative 08/19/2011 1436   PROTEINUR Negative 08/19/2011 1436   NITRITE Negative 08/19/2011 1436   LEUKOCYTESUR Negative 08/19/2011 1436   CRITICAL CARE Performed by: Dr. Sedalia Muta  Total critical care time: 35 minutes  Critical care time was exclusive of separately billable procedures and treating other patients.  Critical care was necessary to treat or prevent imminent or life-threatening deterioration.  Critical care was time spent personally  by me on the following activities: development of treatment plan with patient and/or surrogate as well as nursing, discussions with consultants, evaluation of patient's response to treatment, examination of patient, obtaining history from patient or surrogate, ordering and performing treatments and interventions, ordering and review of laboratory studies, ordering and review of radiographic studies, pulse oximetry and re-evaluation of patient's condition.  This  document was prepared using Dragon Voice Recognition software and may include unintentional dictation errors.  Dr. Sedalia Muta [facility]  If 7PM-7AM, please contact overnight-coverage provider If 7AM-7PM, please contact day attending provider [email or web address]  10/27/2022, 4:35 PM

## 2022-10-27 NOTE — Assessment & Plan Note (Addendum)
Former tobacco user, patient quit in November 2023

## 2022-10-27 NOTE — Assessment & Plan Note (Addendum)
Extensive occlusive acute DVT in major deep veins in the right lower extremity Heparin per pharmacy has been ordered Vascular surgery specialist has been consulted via secure chat and Epic order Vascular surgery aware of patient and tentatively plan for thrombectomy and IVC filter placement on 10/28/2022

## 2022-10-27 NOTE — Assessment & Plan Note (Addendum)
WBC markedly elevated at 52.5 on admission, query leukemia Sodium chloride 1 L bolus ordered Sodium chloride 150 mL/h, 1 day ordered Etiology workup in progress at the time Hematology has been consulted by EDP and by epic order N.p.o. after midnight Admit to telemetry medical, inpatient

## 2022-10-27 NOTE — Consult Note (Addendum)
Chief Complaint: Patient was seen in consultation today for abnormal peripheral blood smear with blasts at the request of Dr. Orlie Dakin  Referring Physician(s): Dr. Orlie Dakin  Supervising Physician: Pernell Dupre  Patient Status: [facility] - ED  History of Present Illness: Tommy [name] is a 72 y.o. male with PMHx significant for back surgery L4-L5 in 2013 and CAD s/p CABG. Patient presents to ED with complaints of low back pain and right lower extremity pain for months and progressive swelling. MR and CT imaging done without acute findings, right lower extremity venous ultrasound done revealed extensive occlusive acute DVT in major deep veins. Patient also had lab work which was abnormal and showing blast cells, request received for bone marrow biopsy.   The patient denies any current chest pain or shortness of breath. He does state he has had some occasional lightheadedness but no syncope. The patient denies any history of sleep apnea or chronic oxygen use. He has no known complications to sedation.    Past Medical History:  Diagnosis Date   Coronary artery disease    Renal disorder     Past Surgical History:  Procedure Laterality Date   BACK SURGERY     CARDIAC SURGERY     KIDNEY SURGERY      Allergies: Patient has no known allergies.  Medications: Prior to Admission medications   Medication Sig Start Date End Date Taking? Authorizing Provider  atorvastatin (LIPITOR) 80 MG tablet Take 80 mg by mouth. 03/12/15 04/11/15  [provider]  Glucosamine Sulfate 500 MG TABS Take by mouth.    [provider]  methylPREDNISolone (MEDROL DOSEPAK) 4 MG TBPK tablet Use as directed x 6 days. 10/21/22   Drake Leach, PA-C  metoprolol tartrate (LOPRESSOR) 25 MG tablet Take 25 mg by mouth. 03/12/15 03/11/16  [provider]  Multiple Vitamin (MULTI-VITAMINS) TABS Take by mouth.    [provider]  nicotine (RA NICOTINE) 7 mg/24hr patch Place  onto the skin. 03/12/15   [provider]  Omega-3 1000 MG CAPS Take by mouth.    [provider]  oxyCODONE (OXY IR/ROXICODONE) 5 MG immediate release tablet Take 5 mg by mouth. 03/12/15   [provider]     History reviewed. No pertinent family history.  Social History   Socioeconomic History   Marital status: Married    Spouse name: Not on file   Number of children: Not on file   Years of education: Not on file   Highest education level: Not on file  Occupational History   Not on file  Tobacco Use   Smoking status: Former    Types: Cigarettes    Quit date: 02/23/2015    Years since quitting: 7.6   Smokeless tobacco: Never  Substance and Sexual Activity   Alcohol use: Not Currently   Drug use: Never   Sexual activity: Not Currently  Other Topics Concern   Not on file  Social History Narrative   Not on file   Social Determinants of Health   Financial Resource Strain: Not on file  Food Insecurity: Not on file  Transportation Needs: Not on file  Physical Activity: Not on file  Stress: Not on file  Social Connections: Not on file   Review of Systems: A 12 point ROS discussed and pertinent positives are indicated in the HPI above.  All other systems are negative.  Review of Systems  Vital Signs: BP (!) 148/90 (BP Location: Left Arm)   Pulse 88  Temp 98 F (36.7 C) (Oral)   Resp 20   Ht 5\' 10"  (1.778 m)   Wt 169 lb 12.1 oz (77 kg)   SpO2 96%   BMI 24.36 kg/m   Advance Care Plan: The advanced care plan/surrogate decision maker was discussed at the time of visit and documented in the medical record.   Physical Exam Constitutional:      General: He is not in acute distress. Cardiovascular:     Rate and Rhythm: Normal rate.  Pulmonary:     Effort: Pulmonary effort is normal. No respiratory distress.  Skin:    General: Skin is warm and dry.  Neurological:     Mental Status: He is alert and oriented to person, place, and time.     Imaging: US Venous Img Lower Right (DVT Study)  Result Date: 10/27/2022 CLINICAL DATA:  Calf pain EXAM: Right LOWER EXTREMITY VENOUS DOPPLER ULTRASOUND TECHNIQUE: Gray-scale sonography with compression, as well as color and duplex ultrasound, were performed to evaluate the deep venous system(s) from the level of the common femoral vein through the popliteal and proximal calf veins. COMPARISON:  None Available. FINDINGS: VENOUS Extensive acute occlusive DVT is noted in right lower extremity involving common femoral, femoral, popliteal, posterior tibial and peroneal veins. Limited views of the contralateral common femoral vein are unremarkable. OTHER None. Limitations: none IMPRESSION: Extensive occlusive acute DVT is noted in major deep veins in right lower extremity. Electronically Signed   By: Ernie Avena M.D.   On: 10/27/2022 12:56   MR LUMBAR SPINE WO CONTRAST  Result Date: 10/27/2022 CLINICAL DATA:  Low back and right leg pain.  Remote back surgery. EXAM: MRI LUMBAR SPINE WITHOUT CONTRAST TECHNIQUE: Multiplanar, multisequence MR imaging of the lumbar spine was performed. No intravenous contrast was administered. COMPARISON:  Lumbar spine radiographs 09/12/2016. Lumbar MRI from [facility] 10/03/2016. Abdominopelvic CT 12/13/2011. FINDINGS: Segmentation: Transitional lumbosacral anatomy. In keeping with the numbering applied to the previous study, there is a transitional, nearly fully sacralized L5 segment. Alignment: Mild convex left scoliosis. Minimal retrolisthesis at L4-5. Vertebrae: No worrisome osseous lesion, acute fracture or pars defect. Chronic endplate degenerative changes at L3-4 and L4-5. Conus medullaris: Extends to the L1 level and appears normal. Paraspinal and other soft tissues: No significant paraspinal findings. 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 A chronic peripherally calcified collection anterolateral to  the right psoas muscle appears unchanged. There is a heterogeneous sebaceous cyst along the posterior left lower back which appears unchanged. Disc levels: Sagittal images demonstrate no significant disc space findings within the visualized lower thoracic spine. L1-2: Preserved disc height. There is mild disc bulging with a shallow left foraminal disc protrusion. Mild left foraminal narrowing without definite L1 nerve root encroachment. L2-3: Mild loss of disc height with mild disc bulging, facet and ligamentous hypertrophy. No significant spinal stenosis or nerve root encroachment. L3-4: Chronic loss of disc height with annular disc bulging and endplate osteophytes asymmetric to the right. Mild facet and ligamentous hypertrophy. The spinal canal and lateral recesses are patent. Mild right foraminal narrowing appears stable. L4-5: Remote right hemilaminectomy. Chronic loss of disc height with annular disc bulging and endplate osteophytes. Mild bilateral facet hypertrophy. Stable mild scarring in the right lateral recess. The spinal canal and left lateral recess are widely patent. Unchanged mild foraminal narrowing bilaterally. L5-S1: As numbered, this disc space is transitional without acquired abnormality. IMPRESSION: 1. Transitional lumbosacral anatomy. In keeping with the numbering applied  to the previous MRI, the transitional segment is nearly fully sacralized at L5. 2. No acute findings or clear explanation for the patient's symptoms. 3. Stable postsurgical changes on the right at L4-5 with mild scarring in the right lateral recess and mild foraminal narrowing bilaterally. 4. Stable mild right foraminal narrowing at L3-4 due to asymmetric disc bulging and endplate osteophytes. 5. Stable chronic peripherally calcified collection anterolateral to the right psoas muscle. Electronically Signed   By: Carey Bullocks M.D.   On: 10/27/2022 11:52   CT TIBIA FIBULA RIGHT W CONTRAST  Result Date: 10/27/2022 CLINICAL  DATA:  Soft tissue infection suspected, lower leg. Right posterior leg pain and swelling for 1 month. EXAM: CT OF THE LOWER RIGHT EXTREMITY WITH CONTRAST TECHNIQUE: Multidetector CT imaging of the right lower leg was performed according to the standard protocol following intravenous contrast administration. RADIATION DOSE REDUCTION: This exam was performed according to the departmental dose-optimization program which includes automated exposure control, adjustment of the mA and/or kV according to patient size and/or use of iterative reconstruction technique. CONTRAST:  OMNIPAQUE IOHEXOL 300 MG/ML  SOLN COMPARISON:  None Available. FINDINGS: Bones/Joint/Cartilage No evidence of acute fracture or dislocation. The right tibia and fibula appear normal. No significant arthropathy or effusion identified at the right knee or ankle. Ligaments Suboptimally assessed by CT. Muscles and Tendons No muscular abnormalities are identified in the lower leg. Specifically, no evidence of intramuscular fluid collection, atrophy or suspicious enhancement. The patellar tendon appears normal. The quadriceps tendon is incompletely visualized, although appears intact. The ankle tendons appear intact. Soft tissues This study was performed with intravenous contrast during the arterial phase of contrast administration. There is popliteal and runoff atherosclerosis without evidence of large vessel arterial occlusion. No significant venous opacification is demonstrated. The popliteal vein appears distended with central low-density, findings which are suspicious for deep venous thrombosis. No other significant soft tissue abnormalities are identified. IMPRESSION: 1. No acute osseous findings or significant arthropathy identified in the right lower leg. 2. No evidence of intramuscular fluid collection, atrophy or suspicious enhancement. 3. The popliteal vein appears distended with central low-density, suspicious for deep venous thrombosis.  This is suboptimally evaluated by this examination performed during the arterial phase of contrast administration. Patient is already scheduled for venous doppler ultrasound today. 4. Atherosclerosis without evidence of large vessel arterial occlusion. Electronically Signed   By: Carey Bullocks M.D.   On: 10/27/2022 11:39    Labs:  CBC: Recent Labs    10/27/22 0857  WBC 52.5*  HGB 15.0  HCT 53.3*  PLT 94*    COAGS: No results for input(s): "INR", "APTT" in the last 8760 hours.  BMP: Recent Labs    10/27/22 0857  NA 138  K 4.2  CL 105  CO2 21*  GLUCOSE 120*  BUN 22  CALCIUM 8.7*  CREATININE 1.37*  GFRNONAA 55*    Assessment and Plan: 72 year old male with complaints of low back pain and right lower extremity pain for months and progressive swelling. Imaging studies- MR and CT done without acute findings, right lower extremity venous ultrasound done revealed extensive occlusive acute DVT in major deep veins. Patient also had lab work which was abnormal and showing blast cells and concern for AML, request received for bone marrow biopsy.   The patient will be NPO after midnight for 5/31, labs and vitals have been reviewed.  Risks and benefits of image guided bone marrow biopsy with moderate sedation was discussed with the patient  and patient's family including, but not limited to bleeding, infection, damage to adjacent structures or low yield requiring additional tests.  All of the questions were answered and there is agreement to proceed.  Consent signed and in IR.  Thank you for this interesting consult.  I greatly enjoyed meeting Benajmin Dunham and look forward to participating in their care.  A copy of this report was sent to the requesting provider on this date.  Electronically Signed: Berneta Levins, PA-C 10/27/2022, 1:28 PM   I spent a total of 20 Minutes in face to face in clinical consultation, greater than 50% of which was counseling/coordinating  care for abnormal peripheral blood smear with blasts.

## 2022-10-27 NOTE — Consult Note (Signed)
ANTICOAGULATION CONSULT NOTE  Pharmacy Consult for heparin infusion Indication: DVT  No Known Allergies  Patient Measurements: Height: 5\' 10"  (177.8 cm) Weight: 77 kg (169 lb 12.1 oz) IBW/kg (Calculated) : 73 Heparin Dosing Weight: 77 kg  Vital Signs: Temp: 98 F (36.7 C) (05/30 1922) Temp Source: Oral (05/30 1922) BP: 111/63 (05/30 1922) Pulse Rate: 92 (05/30 1922)  Labs: Recent Labs    10/27/22 0857 10/27/22 1219 10/27/22 2023  HGB 15.0  --   --   HCT 53.3*  --   --   PLT 94*  --   --   APTT  --  37*  --   LABPROT  --  15.4*  --   INR  --  1.2  --   HEPARINUNFRC  --   --  0.13*  CREATININE 1.37*  --   --   CKTOTAL 24*  --   --      Estimated Creatinine Clearance: 51.8 mL/min (A) (by C-G formula based on SCr of 1.37 mg/dL (H)).   Medical History: Past Medical History:  Diagnosis Date   Coronary artery disease    Renal disorder     Medications:  No home anticoagulants per pharmacist review  Assessment: 86 you male presented to ED due to right leg swelling.  Imaging revealed right lower extremity DVT.  Pharmacy consulted to initiate heparin infusion.  Goal of Therapy:  Heparin level 0.3-0.7 units/ml Monitor platelets by anticoagulation protocol: Yes   Plan:  Heparin subtherapeutic (0.13)  Give 2300 units bolus x 1 Increase heparin infusion to 1500 units/hr Check anti-Xa level in 6 hours following rate change Continue to monitor H&H and platelets  Sharen Hones, PharmD, BCPS Clinical Pharmacist   10/27/2022,9:22 PM

## 2022-10-27 NOTE — Assessment & Plan Note (Addendum)
Metoprolol tartrate 25 mg p.o. twice daily Hydralazine 5 mg IV every 8 hours as needed for SBP greater 175, 4 days ordered

## 2022-10-27 NOTE — H&P (View-Only) (Signed)
Hospital Consult    Reason for Consult:  Right Lower extremity DVT Requesting Physician:  Dr Amy Cox MD MRN #:  [id number]  History of Present Illness: This is a 71 y.o. male with PMHx significant for back surgery L4-L5 in 2013 and CAD s/p CABG. Patient presents to ED with complaints of low back pain and right lower extremity pain for months and progressive swelling. MR and CT imaging done without acute findings, right lower extremity venous ultrasound done revealed extensive occlusive acute DVT in major deep veins. Patient endorses pain to his right lower extremity which started about 5 weeks ago. He states he felt something "pop". Pain is on exertion and at rest. Patient also had lab work which was abnormal and showing blast cells, request received for bone marrow biopsy.    The patient denies any current chest pain or shortness of breath. He does state he has had some occasional lightheadedness but no syncope. The patient denies any history of sleep apnea or chronic oxygen use. He has no known complications to sedation. No other complaints. Vitals all remain stable.     Past Medical History:  Diagnosis Date   Coronary artery disease    Renal disorder     Past Surgical History:  Procedure Laterality Date   BACK SURGERY     CARDIAC SURGERY     KIDNEY SURGERY      No Known Allergies  Prior to Admission medications   Medication Sig Start Date End Date Taking? Authorizing Provider  atorvastatin (LIPITOR) 80 MG tablet Take 80 mg by mouth. 03/12/15 10/27/22 Yes [provider]  metoprolol tartrate (LOPRESSOR) 25 MG tablet Take 25 mg by mouth 2 (two) times daily. 03/12/15 10/27/22 Yes [provider]  albuterol (VENTOLIN HFA) 108 (90 Base) MCG/ACT inhaler Inhale 2 puffs into the lungs every 4 (four) hours as needed for shortness of breath or wheezing. Patient not taking: Reported on 10/27/2022 08/12/22   [provider]  diclofenac Sodium (VOLTAREN) 1 % GEL Apply  4 g topically 4 (four) times daily as needed (pain). Patient not taking: Reported on 10/27/2022 10/13/22   [provider]  Glucosamine Sulfate 500 MG TABS Take by mouth.    [provider]  methylPREDNISolone (MEDROL DOSEPAK) 4 MG TBPK tablet Use as directed x 6 days. Patient not taking: Reported on 10/27/2022 10/21/22   Luna, Stacy, PA-C  Multiple Vitamin (MULTI-VITAMINS) TABS Take by mouth. Patient not taking: Reported on 10/27/2022    [provider]  nicotine (RA NICOTINE) 7 mg/24hr patch Place onto the skin. 03/12/15   [provider]  Omega-3 1000 MG CAPS Take by mouth.    [provider]  oxyCODONE (OXY IR/ROXICODONE) 5 MG immediate release tablet Take 5 mg by mouth. 03/12/15   [provider]  tiZANidine (ZANAFLEX) 4 MG tablet Take 4 mg by mouth every 8 (eight) hours as needed for muscle spasms. Patient not taking: Reported on 10/27/2022 10/23/22   [provider]  TRELEGY ELLIPTA 100-62.5-25 MCG/ACT AEPB Inhale 1 puff into the lungs daily. Patient not taking: Reported on 10/27/2022    [provider]    Social History   Socioeconomic History   Marital status: Married    Spouse name: Not on file   Number of children: Not on file   Years of education: Not on file   Highest education level: Not on file  Occupational History   Not on file  Tobacco Use   Smoking status: Former      Types: Cigarettes    Quit date: 02/23/2015    Years since quitting: 7.6   Smokeless tobacco: Never  Substance and Sexual Activity   Alcohol use: Not Currently   Drug use: Never   Sexual activity: Yes  Other Topics Concern   Not on file  Social History Narrative   Not on file   Social Determinants of Health   Financial Resource Strain: Not on file  Food Insecurity: No Food Insecurity (10/27/2022)   Hunger Vital Sign    Worried About Running Out of Food in the Last Year: Never true    Ran Out of Food in the Last Year: Never true   Transportation Needs: No Transportation Needs (10/27/2022)   PRAPARE - Transportation    Lack of Transportation (Medical): No    Lack of Transportation (Non-Medical): No  Physical Activity: Not on file  Stress: Not on file  Social Connections: Not on file  Intimate Partner Violence: Not At Risk (10/27/2022)   Humiliation, Afraid, Rape, and Kick questionnaire    Fear of Current or Ex-Partner: No    Emotionally Abused: No    Physically Abused: No    Sexually Abused: No     Family History  Problem Relation Age of Onset   Non-Hodgkin's lymphoma Brother     ROS: Otherwise negative unless mentioned in HPI  Physical Examination  Vitals:   10/27/22 1336 10/27/22 1415  BP: (!) 140/88 (!) 147/82  Pulse: 80 78  Resp: 18 20  Temp:  97.8 F (36.6 C)  SpO2: 96% 92%   Body mass index is 24.36 kg/m.  General:  WDWN in NAD Gait: Not observed HENT: WNL, normocephalic Pulmonary: normal non-labored breathing, without Rales, rhonchi,  wheezing Cardiac: regular, without  Murmurs, rubs or gallops; without carotid bruits Abdomen: Positive bowel sounds, soft, NT/ND, no masses Skin: without rashes Vascular Exam/Pulses: Palpable pulses throughout. Bilateral lower extremities are warm to touch. Extremities: without ischemic changes, without Gangrene , without cellulitis; without open wounds;  Musculoskeletal: no muscle wasting or atrophy  Neurologic: A&O X 3;  No focal weakness or paresthesias are detected; speech is fluent/normal Psychiatric:  The pt has Normal affect. Lymph:  Unremarkable  CBC    Component Value Date/Time   WBC 52.5 (HH) 10/27/2022 0857   RBC 6.34 (H) 10/27/2022 0857   HGB 15.0 10/27/2022 0857   HGB 11.3 (L) 08/25/2011 0301   HCT 53.3 (H) 10/27/2022 0857   HCT 33.7 (L) 08/25/2011 0301   PLT 94 (L) 10/27/2022 0857   PLT 176 08/25/2011 0301   MCV 84.1 10/27/2022 0857   MCV 95 08/25/2011 0301   MCH 23.7 (L) 10/27/2022 0857   MCHC 28.1 (L) 10/27/2022 0857   RDW  19.9 (H) 10/27/2022 0857   RDW 14.5 08/25/2011 0301   LYMPHSABS 1.9 10/27/2022 0857   LYMPHSABS 0.3 (L) 08/25/2011 0301   MONOABS 5.4 (H) 10/27/2022 0857   MONOABS 0.8 (H) 08/25/2011 0301   EOSABS 0.3 10/27/2022 0857   EOSABS 0.0 08/25/2011 0301   BASOSABS 1.3 (H) 10/27/2022 0857   BASOSABS 0.0 08/25/2011 0301    BMET    Component Value Date/Time   NA 138 10/27/2022 0857   NA 144 08/27/2011 0438   K 4.2 10/27/2022 0857   K 3.3 (L) 08/27/2011 0438   CL 105 10/27/2022 0857   CL 107 08/27/2011 0438   CO2 21 (L) 10/27/2022 0857   CO2 28 08/27/2011 0438   GLUCOSE 120 (H) 10/27/2022 0857   GLUCOSE 110 (H)   08/27/2011 0438   BUN 22 10/27/2022 0857   BUN 16 08/27/2011 0438   CREATININE 1.37 (H) 10/27/2022 0857   CREATININE 0.89 08/27/2011 0438   CALCIUM 8.7 (L) 10/27/2022 0857   CALCIUM 8.2 (L) 08/27/2011 0438   GFRNONAA 55 (L) 10/27/2022 0857   GFRNONAA >60 08/27/2011 0438   GFRAA >60 08/27/2011 0438    COAGS: Lab Results  Component Value Date   INR 1.2 10/27/2022   INR 1.1 08/22/2011     Non-Invasive Vascular Imaging:   EXAM: 10/27/2022 Right LOWER EXTREMITY VENOUS DOPPLER ULTRASOUND  FINDINGS: VENOUS   Extensive acute occlusive DVT is noted in right lower extremity involving common femoral, femoral, popliteal, posterior tibial and peroneal veins.   Limited views of the contralateral common femoral vein are unremarkable.   OTHER   None.   Limitations: none   IMPRESSION: Extensive occlusive acute DVT is noted in major deep veins in right lower extremity.  EXAM: 10/27/2022 CT OF THE LOWER RIGHT EXTREMITY WITH CONTRAST   TECHNIQUE: Multidetector CT imaging of the right lower leg was performed according to the standard protocol following intravenous contrast administration.   RADIATION DOSE REDUCTION: This exam was performed according to the departmental dose-optimization program which includes automated exposure control, adjustment of the mA and/or kV  according to patient size and/or use of iterative reconstruction technique.   CONTRAST:  100mL OMNIPAQUE IOHEXOL 300 MG/ML  SOLN   COMPARISON:  None Available.   FINDINGS: Bones/Joint/Cartilage   No evidence of acute fracture or dislocation. The right tibia and fibula appear normal. No significant arthropathy or effusion identified at the right knee or ankle.   Ligaments   Suboptimally assessed by CT.   Muscles and Tendons   No muscular abnormalities are identified in the lower leg. Specifically, no evidence of intramuscular fluid collection, atrophy or suspicious enhancement. The patellar tendon appears normal. The quadriceps tendon is incompletely visualized, although appears intact. The ankle tendons appear intact.   Soft tissues   This study was performed with intravenous contrast during the arterial phase of contrast administration. There is popliteal and runoff atherosclerosis without evidence of large vessel arterial occlusion. No significant venous opacification is demonstrated. The popliteal vein appears distended with central low-density, findings which are suspicious for deep venous thrombosis. No other significant soft tissue abnormalities are identified.   IMPRESSION: 1. No acute osseous findings or significant arthropathy identified in the right lower leg. 2. No evidence of intramuscular fluid collection, atrophy or suspicious enhancement. 3. The popliteal vein appears distended with central low-density, suspicious for deep venous thrombosis. This is suboptimally evaluated by this examination performed during the arterial phase of contrast administration. Patient is already scheduled for venous doppler ultrasound today. 4. Atherosclerosis without evidence of large vessel arterial occlusion.  Statin:  Yes.   Beta Blocker:  Yes.   Aspirin:  No. ACEI:  No. ARB:  No. CCB use:  No Other antiplatelets/anticoagulants:  No.    ASSESSMENT/PLAN: This is a  71 y.o. male This is a 71 y.o. male with PMHx significant for back surgery L4-L5 in 2013 and CAD s/p CABG. Patient presents to ED with complaints of low back pain and right lower extremity pain for months and progressive swelling. MR and CT imaging done without acute findings, right lower extremity venous ultrasound done revealed extensive occlusive acute DVT in major deep veins.   PLAN: Vascular surgery plans on taking the patient to the vascular lab Friday, 10/28/2022 for a right lower extremity Peripheral Vascular Thrombectomy with   possible intervention.  I discussed in detail with the patient the procedure, benefits, risks, and complications.  Patient verbalizes understanding.  I answered all the patient's questions this afternoon.  Patient would like to proceed as soon as possible. He will be made n.p.o. after midnight.   -Discussed the plan in detail with Dr. Jason Dew MD and he agrees with the plan.   Tommy [name] 10/27/2022 4:21 PM  

## 2022-10-27 NOTE — Assessment & Plan Note (Signed)
Home metoprolol tartrate 25 mg p.o. twice daily resumed, atorvastatin 80 mg nightly resumed

## 2022-10-27 NOTE — Consult Note (Signed)
Tommy [name]  [phone] [phone]  ID: Tommy [name] OB: 05-Dec-1950  MR#: [id number]  MWU#:[id number]  Patient Care Team: Pcp, No as PCP - General  Tommy COMPLAINT: Right leg DVT, leukocytosis with 10% blasts.  INTERVAL HISTORY: Patient is a 72 year old male who noticed a slight increase in pain in his right leg approximately 5 weeks ago.  Pain continued to get worse associated with increased swelling.  He also reports a a recent unintentional 25+ pound weight loss.  Upon evaluation emergency room, patient was noted to have extensive right leg DVT as well as a white blood cell count of 52 with 10% blasts on peripheral smear.  He otherwise feels well.  He has no neurologic complaints.  He denies any recent fevers or illnesses.  He has a fair appetite.  He has no chest pain, shortness of breath, cough, or hemoptysis.  He denies any nausea, vomiting, constipation, or diarrhea.  He has no urinary complaints.  Patient offers no further specific complaints today.  REVIEW OF SYSTEMS:   Review of Systems  Constitutional:  Positive for weight loss. Negative for fever and malaise/fatigue.  Respiratory: Negative.  Negative for cough, hemoptysis and shortness of breath.   Cardiovascular:  Positive for leg swelling. Negative for chest pain.  Gastrointestinal: Negative.  Negative for abdominal pain.  Genitourinary: Negative.  Negative for dysuria.  Musculoskeletal: Negative.  Negative for back pain.  Skin: Negative.  Negative for rash.  Neurological: Negative.  Negative for dizziness, focal weakness, weakness and headaches.  Psychiatric/Behavioral: Negative.  The patient is not nervous/anxious.     As per HPI. Otherwise, a complete review of systems is negative.  PAST MEDICAL HISTORY: Past Medical History:  Diagnosis Date   Coronary artery disease    Renal disorder     PAST SURGICAL HISTORY: Past Surgical History:  Procedure Laterality Date    BACK SURGERY     CARDIAC SURGERY     KIDNEY SURGERY      FAMILY HISTORY: Family History  Problem Relation Age of Onset   Non-Hodgkin's lymphoma Brother     ADVANCED DIRECTIVES (Y/N):  @ADVDIR @  HEALTH MAINTENANCE: Social History   Tobacco Use   Smoking status: Former    Types: Cigarettes    Quit date: 02/23/2015    Years since quitting: 7.6   Smokeless tobacco: Never  Substance Use Topics   Alcohol use: Not Currently   Drug use: Never     Colonoscopy:  PAP:  Bone density:  Lipid panel:  No Known Allergies  Current Facility-Administered Medications  Medication Dose Route Frequency Provider Last Rate Last Admin   0.9 %  sodium chloride infusion   Intravenous Continuous Cox, Amy N, DO 150 mL/hr at 10/27/22 1558 Infusion Verify at 10/27/22 1558   acetaminophen (TYLENOL) tablet 650 mg  650 mg Oral Q6H PRN Cox, Amy N, DO       Or   acetaminophen (TYLENOL) suppository 650 mg  650 mg Rectal Q6H PRN Cox, Amy N, DO       albuterol (PROVENTIL) (2.5 MG/3ML) 0.083% nebulizer solution 2.5 mg  2.5 mg Nebulization Q6H PRN Cox, Amy N, DO       atorvastatin (LIPITOR) tablet 80 mg  80 mg Oral QHS Cox, Amy N, DO       heparin ADULT infusion 100 units/mL (25000 units/259mL)  1,250 Units/hr Intravenous Continuous Cox, Amy N, DO 12.5 mL/hr at 10/27/22 1412 1,250 Units/hr at 10/27/22 1412   hydrALAZINE (APRESOLINE) injection 5  mg  5 mg Intravenous Q8H PRN Cox, Amy N, DO       HYDROmorphone (DILAUDID) injection 0.5 mg  0.5 mg Intravenous Q4H PRN Cox, Amy N, DO       melatonin tablet 5 mg  5 mg Oral QHS PRN Cox, Amy N, DO       metoprolol tartrate (LOPRESSOR) tablet 25 mg  25 mg Oral BID Cox, Amy N, DO       morphine (PF) 4 MG/ML injection 4 mg  4 mg Intravenous Q4H PRN Cox, Amy N, DO       nicotine (NICODERM CQ - dosed in mg/24 hours) patch 21 mg  21 mg Transdermal Daily PRN Cox, Amy N, DO       Or   nicotine polacrilex (NICORETTE) gum 2 mg  2 mg Oral PRN Cox, Amy N, DO       ondansetron  (ZOFRAN) tablet 4 mg  4 mg Oral Q6H PRN Cox, Amy N, DO       Or   ondansetron (ZOFRAN) injection 4 mg  4 mg Intravenous Q6H PRN Cox, Amy N, DO       oxyCODONE (Oxy IR/ROXICODONE) immediate release tablet 5 mg  5 mg Oral Q6H PRN Cox, Amy N, DO       senna-docusate (Senokot-S) tablet 1 tablet  1 tablet Oral QHS PRN Cox, Amy N, DO        OBJECTIVE: Vitals:   10/27/22 1415 10/27/22 1630  BP: (!) 147/82 108/68  Pulse: 78 92  Resp: 20 18  Temp: 97.8 F (36.6 C) 98.2 F (36.8 C)  SpO2: 92% 91%     Body mass index is 24.36 kg/m.    ECOG FS:1 - Symptomatic but completely ambulatory  General: Well-developed, well-nourished, no acute distress. Eyes: Pink conjunctiva, anicteric sclera. HEENT: Normocephalic, moist mucous membranes. Lungs: No audible wheezing or coughing. Heart: Regular rate and rhythm. Abdomen: Soft, nontender, no obvious distention. Musculoskeletal: No edema, cyanosis, or clubbing. Neuro: Alert, answering all questions appropriately. Cranial nerves grossly intact. Skin: No rashes or petechiae noted. Psych: Normal affect. Lymphatics: No cervical, calvicular, axillary or inguinal LAD.   LAB RESULTS:  Lab Results  Component Value Date   NA 138 10/27/2022   K 4.2 10/27/2022   CL 105 10/27/2022   CO2 21 (L) 10/27/2022   GLUCOSE 120 (H) 10/27/2022   BUN 22 10/27/2022   CREATININE 1.37 (H) 10/27/2022   CALCIUM 8.7 (L) 10/27/2022   GFRNONAA 55 (L) 10/27/2022   GFRAA >60 08/27/2011    Lab Results  Component Value Date   WBC 52.5 (HH) 10/27/2022   NEUTROABS 33.6 (H) 10/27/2022   HGB 15.0 10/27/2022   HCT 53.3 (H) 10/27/2022   MCV 84.1 10/27/2022   PLT 94 (L) 10/27/2022     STUDIES: US Venous Img Lower Right (DVT Study)  Result Date: 10/27/2022 CLINICAL DATA:  Calf pain EXAM: Right LOWER EXTREMITY VENOUS DOPPLER ULTRASOUND TECHNIQUE: Gray-scale sonography with compression, as well as color and duplex ultrasound, were performed to evaluate the deep venous  system(s) from the level of the common femoral vein through the popliteal and proximal calf veins. COMPARISON:  None Available. FINDINGS: VENOUS Extensive acute occlusive DVT is noted in right lower extremity involving common femoral, femoral, popliteal, posterior tibial and peroneal veins. Limited views of the contralateral common femoral vein are unremarkable. OTHER None. Limitations: none IMPRESSION: Extensive occlusive acute DVT is noted in major deep veins in right lower extremity. Electronically Signed   By:  Ernie Avena M.D.   On: 10/27/2022 12:56   MR LUMBAR SPINE WO CONTRAST  Result Date: 10/27/2022 CLINICAL DATA:  Low back and right leg pain.  Remote back surgery. EXAM: MRI LUMBAR SPINE WITHOUT CONTRAST TECHNIQUE: Multiplanar, multisequence MR imaging of the lumbar spine was performed. No intravenous contrast was administered. COMPARISON:  Lumbar spine radiographs 09/12/2016. Lumbar MRI from [facility] 10/03/2016. Abdominopelvic CT 12/13/2011. FINDINGS: Segmentation: Transitional lumbosacral anatomy. In keeping with the numbering applied to the previous study, there is a transitional, nearly fully sacralized L5 segment. Alignment: Mild convex left scoliosis. Minimal retrolisthesis at L4-5. Vertebrae: No worrisome osseous lesion, acute fracture or pars defect. Chronic endplate degenerative changes at L3-4 and L4-5. Conus medullaris: Extends to the L1 level and appears normal. Paraspinal and other soft tissues: No significant paraspinal findings. 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 A chronic peripherally calcified collection anterolateral to the right psoas muscle appears unchanged. There is a heterogeneous sebaceous cyst along the posterior left lower back which appears unchanged. Disc levels: Sagittal images demonstrate no significant disc space findings within the visualized lower thoracic spine. L1-2: Preserved disc height.  There is mild disc bulging with a shallow left foraminal disc protrusion. Mild left foraminal narrowing without definite L1 nerve root encroachment. L2-3: Mild loss of disc height with mild disc bulging, facet and ligamentous hypertrophy. No significant spinal stenosis or nerve root encroachment. L3-4: Chronic loss of disc height with annular disc bulging and endplate osteophytes asymmetric to the right. Mild facet and ligamentous hypertrophy. The spinal canal and lateral recesses are patent. Mild right foraminal narrowing appears stable. L4-5: Remote right hemilaminectomy. Chronic loss of disc height with annular disc bulging and endplate osteophytes. Mild bilateral facet hypertrophy. Stable mild scarring in the right lateral recess. The spinal canal and left lateral recess are widely patent. Unchanged mild foraminal narrowing bilaterally. L5-S1: As numbered, this disc space is transitional without acquired abnormality. IMPRESSION: 1. Transitional lumbosacral anatomy. In keeping with the numbering applied to the previous MRI, the transitional segment is nearly fully sacralized at L5. 2. No acute findings or clear explanation for the patient's symptoms. 3. Stable postsurgical changes on the right at L4-5 with mild scarring in the right lateral recess and mild foraminal narrowing bilaterally. 4. Stable mild right foraminal narrowing at L3-4 due to asymmetric disc bulging and endplate osteophytes. 5. Stable chronic peripherally calcified collection anterolateral to the right psoas muscle. Electronically Signed   By: Carey Bullocks M.D.   On: 10/27/2022 11:52   CT TIBIA FIBULA RIGHT W CONTRAST  Result Date: 10/27/2022 CLINICAL DATA:  Soft tissue infection suspected, lower leg. Right posterior leg pain and swelling for 1 month. EXAM: CT OF THE LOWER RIGHT EXTREMITY WITH CONTRAST TECHNIQUE: Multidetector CT imaging of the right lower leg was performed according to the standard protocol following intravenous contrast  administration. RADIATION DOSE REDUCTION: This exam was performed according to the departmental dose-optimization program which includes automated exposure control, adjustment of the mA and/or kV according to patient size and/or use of iterative reconstruction technique. CONTRAST:  OMNIPAQUE IOHEXOL 300 MG/ML  SOLN COMPARISON:  None Available. FINDINGS: Bones/Joint/Cartilage No evidence of acute fracture or dislocation. The right tibia and fibula appear normal. No significant arthropathy or effusion identified at the right knee or ankle. Ligaments Suboptimally assessed by CT. Muscles and Tendons No muscular abnormalities are identified in the lower leg. Specifically, no evidence of intramuscular fluid collection, atrophy or suspicious enhancement. The  patellar tendon appears normal. The quadriceps tendon is incompletely visualized, although appears intact. The ankle tendons appear intact. Soft tissues This study was performed with intravenous contrast during the arterial phase of contrast administration. There is popliteal and runoff atherosclerosis without evidence of large vessel arterial occlusion. No significant venous opacification is demonstrated. The popliteal vein appears distended with central low-density, findings which are suspicious for deep venous thrombosis. No other significant soft tissue abnormalities are identified. IMPRESSION: 1. No acute osseous findings or significant arthropathy identified in the right lower leg. 2. No evidence of intramuscular fluid collection, atrophy or suspicious enhancement. 3. The popliteal vein appears distended with central low-density, suspicious for deep venous thrombosis. This is suboptimally evaluated by this examination performed during the arterial phase of contrast administration. Patient is already scheduled for venous doppler ultrasound today. 4. Atherosclerosis without evidence of large vessel arterial occlusion. Electronically Signed   By: Carey Bullocks M.D.   On: 10/27/2022 11:39    ASSESSMENT:  Right leg DVT, leukocytosis with 10% blasts.  PLAN:    Leukocytosis with 10% blast: Concern is acute leukemia and patient has bone marrow biopsy scheduled for tomorrow.  Appreciate interventional radiology input.  No further intervention is needed at this time.  Results from biopsy will not be available until next week, therefore okay to discharge from an oncology standpoint and follow-up in the cancer [name] as an outpatient. Right leg DVT: Patient currently on heparin drip, unclear if thrombectomy is needed.  Okay to transition patient to Eliquis after biopsy.  He will require minimum of 6 months of treatment. Thrombocytopenia: Mild.  Bone marrow biopsy as above. Renal insufficiency: Mild.  Unclear patient's baseline.  Monitor.  Appreciate consult, will follow.  Jeralyn Ruths, MD   10/27/2022 4:44 PM

## 2022-10-27 NOTE — ED Triage Notes (Addendum)
C/O right leg posterior pain, swelling x 1 month.  C/O calf pain, behind knee.  Pain worsening over the month.    MRI done yesterday for back.  Also reports recent weight loss, difficulty sleeping, and difficulty catching breath.

## 2022-10-27 NOTE — ED Provider Notes (Signed)
[facility] Provider Note    Event Date/Time   First MD Initiated Contact with Patient 10/27/22 0845     (approximate)   History   Leg Pain   HPI  Tommy [name] is a 72 y.o. male who presents today for evaluation of leg pain for the past 3 weeks.  Patient is unsure if he had any injury.  He notes that he went for a long car ride to the beach 2 weeks ago, and felt like his leg got more swollen afterwards.  He denies numbness or tingling.  He reports a sharp stabbing pain.  He is able to ambulate.  Denies numbness or tingling.  He reports that he is no longer a smoker.  He put a warm compress on the area, and excellently burned the skin so he had some abrasions.  Patient Active Problem List   Diagnosis Date Noted   Leukocytosis 10/27/2022   Essential hypertension 10/27/2022   Right leg DVT (HCC) 10/27/2022   H/O coronary artery bypass surgery 02/20/2015   Arteriosclerosis of coronary artery 01/27/2015   Blood pressure elevated 01/23/2015   Other long term (current) drug therapy 01/23/2015   Current smoker 11/04/2014   DEPRESSION/ANXIETY 08/11/2009   URI 08/11/2009   HX, PERSONAL, TOBACCO USE 12/07/2006          Physical Exam   Triage Vital Signs: ED Triage Vitals  Enc Vitals Group     BP 10/27/22 0841 (!) 162/101     Pulse Rate 10/27/22 0841 94     Resp 10/27/22 0841 20     Temp 10/27/22 0841 97.8 F (36.6 C)     Temp Source 10/27/22 0841 Oral     SpO2 10/27/22 0841 95 %     Weight 10/27/22 0838 169 lb 12.1 oz (77 kg)     Height 10/27/22 0838 5\' 10"  (1.778 m)     Head Circumference --      Peak Flow --      Pain Score 10/27/22 0838 10     Pain Loc --      Pain Edu? --      Excl. in GC? --     Most recent vital signs: Vitals:   10/27/22 1336 10/27/22 1415  BP: (!) 140/88 (!) 147/82  Pulse: 80 78  Resp: 18 20  Temp:  97.8 F (36.6 C)  SpO2: 96% 92%    Physical Exam Vitals and nursing note reviewed.  Constitutional:       General: Awake and alert. No acute distress.    Appearance: Normal appearance. The patient is normal weight.  HENT:     Head: Normocephalic and atraumatic.     Mouth: Mucous membranes are moist.  Eyes:     General: PERRL. Normal EOMs        Right eye: No discharge.        Left eye: No discharge.     Conjunctiva/sclera: Conjunctivae normal.  Cardiovascular:     Rate and Rhythm: Normal rate and regular rhythm.     Pulses: Normal pulses.  Pulmonary:     Effort: Pulmonary effort is normal. No respiratory distress.     Breath sounds: Normal breath sounds.  Abdominal:     Abdomen is soft. There is no abdominal tenderness. No rebound or guarding. No distention. Musculoskeletal:        General: No swelling. Normal range of motion.     Cervical back: Normal range of motion and neck supple.  Right calf: Superficial abrasions, without surrounding erythema.  There is mild edema noted throughout the foot and lower extremity, nonpitting.  Tenderness to palpation, though compartments are soft and compressible throughout.  2+ DP and PT pulses, normal capillary refill, foot is warm and well-perfused.  No lymphangitis noted.  Negative straight leg raise.  Normal strength and sensation of bilateral lower extremities.  Normal great toe extension against resistance Skin:    General: Skin is warm and dry.     Capillary Refill: Capillary refill takes less than 2 seconds.     Findings: No rash.  Neurological:     Mental Status: The patient is awake and alert.      ED Results / Procedures / Treatments   Labs (all labs ordered are listed, but only abnormal results are displayed) Labs Reviewed  BASIC METABOLIC PANEL - Abnormal; Notable for the following components:      Result Value   CO2 21 (*)    Glucose, Bld 120 (*)    Creatinine, Ser 1.37 (*)    Calcium 8.7 (*)    GFR, Estimated 55 (*)    All other components within normal limits  CBC WITH DIFFERENTIAL/PLATELET - Abnormal; Notable for the  following components:   WBC 52.5 (*)    RBC 6.34 (*)    HCT 53.3 (*)    MCH 23.7 (*)    MCHC 28.1 (*)    RDW 19.9 (*)    Platelets 94 (*)    nRBC 3.0 (*)    Neutro Abs 33.6 (*)    Monocytes Absolute 5.4 (*)    Basophils Absolute 1.3 (*)    Abs Immature Granulocytes 10.03 (*)    All other components within normal limits  CK - Abnormal; Notable for the following components:   Total CK 24 (*)    All other components within normal limits  APTT - Abnormal; Notable for the following components:   aPTT 37 (*)    All other components within normal limits  PROTIME-INR - Abnormal; Notable for the following components:   Prothrombin Time 15.4 (*)    All other components within normal limits  PATHOLOGIST SMEAR REVIEW  HEPARIN LEVEL (UNFRACTIONATED)     EKG     RADIOLOGY I independently reviewed and interpreted imaging and agree with radiologists findings.     PROCEDURES:  Critical Care performed:   Procedures   MEDICATIONS ORDERED IN ED: Medications  acetaminophen (TYLENOL) tablet 650 mg (has no administration in time range)    Or  acetaminophen (TYLENOL) suppository 650 mg (has no administration in time range)  ondansetron (ZOFRAN) tablet 4 mg (has no administration in time range)    Or  ondansetron (ZOFRAN) injection 4 mg (has no administration in time range)  senna-docusate (Senokot-S) tablet 1 tablet (has no administration in time range)  heparin ADULT infusion 100 units/mL (25000 units/22mL) (1,250 Units/hr Intravenous New Bag/Given 10/27/22 1412)  morphine (PF) 4 MG/ML injection 4 mg (has no administration in time range)  HYDROmorphone (DILAUDID) injection 1 mg (1 mg Intravenous Given 10/27/22 1424)  0.9 %  sodium chloride infusion (has no administration in time range)  nicotine (NICODERM CQ - dosed in mg/24 hours) patch 21 mg (has no administration in time range)    Or  nicotine polacrilex (NICORETTE) gum 2 mg (has no administration in time range)  morphine  (PF) 4 MG/ML injection 4 mg (4 mg Intravenous Given 10/27/22 0955)  iohexol (OMNIPAQUE) 300 MG/ML solution 100 mL (100 mLs Intravenous Contrast  Given 10/27/22 1055)  HYDROmorphone (DILAUDID) injection 1 mg (1 mg Intravenous Given 10/27/22 1146)  sodium chloride 0.9 % bolus 1,000 mL (1,000 mLs Intravenous New Bag/Given 10/27/22 1309)  heparin bolus via infusion 5,000 Units (5,000 Units Intravenous Bolus from Bag 10/27/22 1415)     IMPRESSION / MDM / ASSESSMENT AND PLAN / ED COURSE  I reviewed the triage vital signs and the nursing notes.   Differential diagnosis includes, but is not limited to, DVT, hematoma, muscle strain, less likely cellulitis or rhabdomyolysis.  Patient is awake and alert, hemodynamically stable and afebrile.  His compartments are soft and compressible, he has normal pedal pulses, normal sensation, not consistent with compartment syndrome.  Also not consistent with acute arterial occlusion with his normal pulses, foot is warm and well-perfused, normal temperature, and normal capillary refill, all similar to opposite.  Further workup is indicated.  Labs obtained as well as ultrasound of his leg.  Labs reveal a leukocytosis to 52.  I received a call from the pathologist he reports that he has less than 10% blasts.  I discussed with Dr. Orlie Dakin with medical oncology who recommends stat bone marrow biopsy and agrees with hospitalization for pain control and for prompt management.  I discussed these findings with the patient and his wife who agree with plan.  Patient's pain has been managed with morphine and Dilaudid.  CT with contrast of his lower extremity is normal.  Oncology has managed to schedule him for a bone marrow biopsy today.  Patient was also found to have a large acute occlusive DVT of his affected extremity likely contributing significantly to his pain.  This resulted after patient was accepted by the hospitalist, will defer to hospitalist for workup of PE as needed.   Patient did not complain of any chest pain or shortness of breath while in the emergency department.  I discussed with oncology and the admitting hospitalist plan for anticoagulation given his upcoming procedure and oncology recommends heparin which can be easily turned on and off.    I discussed all findings and recommendations with the patient and his wife.  Patient was accepted by Dr. Sedalia Muta with the hospitalist service.  Patient's presentation is most consistent with acute presentation with potential threat to life or bodily function.   Clinical Course as of 10/27/22 1443  Thu Oct 27, 2022  1054 Call from the pathologist who sees blasts, less than 10%, not acute leuk [JP]  1135 Discussed with Dr. Orlie Dakin with oncology, patient needs a bone marrow biopsy [JP]  1258 Call from rads: extensive occlusive DVT [JP]  1318 Discussed extensive DVT with Dr. Sedalia Muta and Dr. Orlie Dakin, patient was started on heparin, ordered by hospitalist [JP]    Clinical Course User Index [JP] Stephania Macfarlane, Herb Grays, PA-C     FINAL CLINICAL IMPRESSION(S) / ED DIAGNOSES   Final diagnoses:  Acute deep vein thrombosis (DVT) of right lower extremity, unspecified vein (HCC)  Myelodysplastic syndrome with excess blasts (HCC)     Rx / DC Orders   ED Discharge Orders     None        Note:  This document was prepared using Dragon voice recognition software and may include unintentional dictation errors.   Jackelyn Hoehn, PA-C 10/27/22 1443    Willy Eddy, MD 10/27/22 1536

## 2022-10-27 NOTE — ED Notes (Signed)
See triage note  Presents with pain and swelling to right lower leg . Pain is mainly behind right knee States discomfort started about 1  month ago  Has been by PCP  Thought it was his sciatic nerve

## 2022-10-27 NOTE — Hospital Course (Addendum)
[name] is a 72 year old male with history of hyperlipidemia, tobacco use, hypertension, who presents to emergency department for chief concerns of right leg pain and swelling for 1 month.  Vitals in the ED showed temperature of 97.8, respiration rate of 20, heart rate 94, blood pressure 162/101, SpO2 of 95% on room air.  Serum sodium is 138, potassium 4.2, chloride 105, bicarb 21, BUN of 22, serum creatinine of 1.37, nonfasting blood glucose 120, eGFR 55, WBC 52.5, hemoglobin 15, platelets of 94.  CK 24.  ED treatment: Morphine 4 mg IV one-time dose, Dilaudid 1 mg IV.

## 2022-10-27 NOTE — Assessment & Plan Note (Signed)
Patient has history of difficult airway for intubation Patient states that he will require glide scope for any intubation

## 2022-10-27 NOTE — Consult Note (Signed)
ANTICOAGULATION CONSULT NOTE  Pharmacy Consult for heparin infusion Indication: DVT  No Known Allergies  Patient Measurements: Height: 5\' 10"  (177.8 cm) Weight: 77 kg (169 lb 12.1 oz) IBW/kg (Calculated) : 73 Heparin Dosing Weight: 77 kg  Vital Signs: Temp: 98 F (36.7 C) (05/30 1253) Temp Source: Oral (05/30 1253) BP: 148/90 (05/30 1253) Pulse Rate: 88 (05/30 1253)  Labs: Recent Labs    10/27/22 0857  HGB 15.0  HCT 53.3*  PLT 94*  CREATININE 1.37*  CKTOTAL 24*    Estimated Creatinine Clearance: 51.8 mL/min (A) (by C-G formula based on SCr of 1.37 mg/dL (H)).   Medical History: Past Medical History:  Diagnosis Date   Coronary artery disease    Renal disorder     Medications:  No home anticoagulants per pharmacist review  Assessment: 40 you male presented to ED due to right leg swelling.  Imaging revealed right lower extremity DVT.  Pharmacy consulted to initiate heparin infusion.  Goal of Therapy:  Heparin level 0.3-0.7 units/ml Monitor platelets by anticoagulation protocol: Yes   Plan:  Give 5000 units bolus x 1 Start heparin infusion at 1250 units/hr Check anti-Xa level in 6 hours and daily while on heparin Continue to monitor H&H and platelets  Barrie Folk, PharmD 10/27/2022,1:06 PM

## 2022-10-28 ENCOUNTER — Encounter
Admission: EM | Disposition: A | Discharge status: Home / Self Care | Payer: Self-pay | Source: Home / Self Care | Attending: Internal Medicine

## 2022-10-28 ENCOUNTER — Inpatient Hospital Stay: Payer: Medicare Other | Admitting: Radiology

## 2022-10-28 ENCOUNTER — Ambulatory Visit (INDEPENDENT_AMBULATORY_CARE_PROVIDER_SITE_OTHER): Payer: Medicare Other | Admitting: Physician Assistant

## 2022-10-28 ENCOUNTER — Inpatient Hospital Stay: Payer: Medicare Other

## 2022-10-28 ENCOUNTER — Encounter: Payer: Self-pay | Admitting: Orthopedic Surgery

## 2022-10-28 ENCOUNTER — Encounter: Payer: Self-pay | Admitting: Physician Assistant

## 2022-10-28 DIAGNOSIS — D4620 Refractory anemia with excess of blasts, unspecified: Secondary | ICD-10-CM

## 2022-10-28 DIAGNOSIS — I82421 Acute embolism and thrombosis of right iliac vein: Secondary | ICD-10-CM

## 2022-10-28 DIAGNOSIS — Z951 Presence of aortocoronary bypass graft: Secondary | ICD-10-CM

## 2022-10-28 DIAGNOSIS — Z91199 Patient's noncompliance with other medical treatment and regimen due to unspecified reason: Secondary | ICD-10-CM

## 2022-10-28 DIAGNOSIS — I871 Compression of vein: Secondary | ICD-10-CM

## 2022-10-28 DIAGNOSIS — I82411 Acute embolism and thrombosis of right femoral vein: Secondary | ICD-10-CM

## 2022-10-28 DIAGNOSIS — I10 Essential (primary) hypertension: Secondary | ICD-10-CM

## 2022-10-28 DIAGNOSIS — I82431 Acute embolism and thrombosis of right popliteal vein: Secondary | ICD-10-CM

## 2022-10-28 HISTORY — PX: IVC FILTER INSERTION: CATH118245

## 2022-10-28 HISTORY — PX: IR BONE MARROW BIOPSY & ASPIRATION: IMG5727

## 2022-10-28 HISTORY — PX: PERIPHERAL VASCULAR THROMBECTOMY: CATH118306

## 2022-10-28 LAB — HEPARIN LEVEL (UNFRACTIONATED)
Heparin Unfractionated: 0.19 IU/mL — ABNORMAL LOW (ref 0.30–0.70)
Heparin Unfractionated: 0.3 IU/mL (ref 0.30–0.70)

## 2022-10-28 LAB — BASIC METABOLIC PANEL
Anion gap: 8 (ref 5–15)
BUN: 17 mg/dL (ref 8–23)
CO2: 20 mmol/L — ABNORMAL LOW (ref 22–32)
Calcium: 7.8 mg/dL — ABNORMAL LOW (ref 8.9–10.3)
Chloride: 109 mmol/L (ref 98–111)
Creatinine, Ser: 1.17 mg/dL (ref 0.61–1.24)
GFR, Estimated: >60 mL/min (ref >60)
Glucose, Bld: 95 mg/dL (ref 70–99)
Potassium: 3.8 mmol/L (ref 3.5–5.1)
Sodium: 137 mmol/L (ref 135–145)

## 2022-10-28 LAB — CBC WITH DIFFERENTIAL/PLATELET
Abs Immature Granulocytes: 9.62 10*3/uL — ABNORMAL HIGH (ref 0.00–0.07)
Basophils Absolute: 1.1 10*3/uL — ABNORMAL HIGH (ref 0.0–0.1)
Basophils Relative: 3 %
Eosinophils Absolute: 0.2 10*3/uL (ref 0.0–0.5)
Eosinophils Relative: 1 %
HCT: 46.1 % (ref 39.0–52.0)
Hemoglobin: 13.3 g/dL (ref 13.0–17.0)
Immature Granulocytes: 21 %
Lymphocytes Relative: 5 %
Lymphs Abs: 2.2 10*3/uL (ref 0.7–4.0)
MCH: 23.8 pg — ABNORMAL LOW (ref 26.0–34.0)
MCHC: 28.9 g/dL — ABNORMAL LOW (ref 30.0–36.0)
MCV: 82.6 fL (ref 80.0–100.0)
Monocytes Absolute: 5.8 10*3/uL — ABNORMAL HIGH (ref 0.1–1.0)
Monocytes Relative: 13 %
Neutro Abs: 27 10*3/uL — ABNORMAL HIGH (ref 1.7–7.7)
Neutrophils Relative %: 57 %
Platelets: 84 10*3/uL — ABNORMAL LOW (ref 150–400)
RBC: 5.58 MIL/uL (ref 4.22–5.81)
RDW: 19.5 % — ABNORMAL HIGH (ref 11.5–15.5)
Smear Review: NORMAL
WBC: 46 10*3/uL — ABNORMAL HIGH (ref 4.0–10.5)
nRBC: 2.5 % — ABNORMAL HIGH (ref 0.0–0.2)

## 2022-10-28 LAB — PROTIME-INR
INR: 1.3 — ABNORMAL HIGH (ref 0.8–1.2)
Prothrombin Time: 16.4 seconds — ABNORMAL HIGH (ref 11.4–15.2)

## 2022-10-28 SURGERY — PERIPHERAL VASCULAR THROMBECTOMY
Anesthesia: Moderate Sedation | Laterality: Right

## 2022-10-28 MED ORDER — FENTANYL CITRATE (PF) 100 MCG/2ML IJ SOLN
INTRAMUSCULAR | Status: AC
  Filled 2022-10-28: qty 2

## 2022-10-28 MED ORDER — FENTANYL CITRATE (PF) 100 MCG/2ML IJ SOLN
INTRAMUSCULAR | Status: AC | PRN
  Administered 2022-10-28 (×2): 50 ug via INTRAVENOUS

## 2022-10-28 MED ORDER — HEPARIN BOLUS VIA INFUSION
2300.0000 [IU] | Freq: Once | INTRAVENOUS | Status: AC
  Administered 2022-10-28: 2300 [IU] via INTRAVENOUS
  Filled 2022-10-28: qty 2300

## 2022-10-28 MED ORDER — HEPARIN SODIUM (PORCINE) 1000 UNIT/ML IJ SOLN
INTRAMUSCULAR | Status: AC
  Filled 2022-10-28: qty 10

## 2022-10-28 MED ORDER — DIPHENHYDRAMINE HCL 50 MG/ML IJ SOLN: 50.0000 mg | Freq: Once | INTRAMUSCULAR | Status: DC | PRN

## 2022-10-28 MED ORDER — MIDAZOLAM HCL 2 MG/2ML IJ SOLN
INTRAMUSCULAR | Status: DC | PRN
  Administered 2022-10-28 (×2): 1 mg via INTRAVENOUS
  Administered 2022-10-28: 2 mg via INTRAVENOUS

## 2022-10-28 MED ORDER — CALCIUM CARBONATE ANTACID 500 MG PO CHEW
800.0000 mg | CHEWABLE_TABLET | Freq: Three times a day (TID) | ORAL | Status: DC | PRN
  Administered 2022-10-28: 800 mg via ORAL
  Filled 2022-10-28: qty 4

## 2022-10-28 MED ORDER — SODIUM CHLORIDE 0.9 % IV SOLN
1.0000 g | INTRAVENOUS | Status: DC
  Administered 2022-10-28 – 2022-10-29 (×2): 1 g via INTRAVENOUS
  Filled 2022-10-28 (×3): qty 10

## 2022-10-28 MED ORDER — MORPHINE SULFATE (PF) 4 MG/ML IV SOLN
4.0000 mg | INTRAVENOUS | Status: DC | PRN
  Administered 2022-10-28 – 2022-10-29 (×3): 4 mg via INTRAVENOUS
  Filled 2022-10-28 (×3): qty 1

## 2022-10-28 MED ORDER — MIDAZOLAM HCL 2 MG/2ML IJ SOLN
INTRAMUSCULAR | Status: AC | PRN
  Administered 2022-10-28: 1 mg via INTRAVENOUS

## 2022-10-28 MED ORDER — HYDROMORPHONE HCL 1 MG/ML IJ SOLN: 1.0000 mg | Freq: Once | INTRAMUSCULAR | Status: DC | PRN

## 2022-10-28 MED ORDER — AZITHROMYCIN 500 MG PO TABS
500.0000 mg | ORAL_TABLET | Freq: Every day | ORAL | Status: DC
  Administered 2022-10-28 – 2022-10-30 (×3): 500 mg via ORAL
  Filled 2022-10-28 (×3): qty 1

## 2022-10-28 MED ORDER — SODIUM CHLORIDE 0.9 % IV SOLN: INTRAVENOUS | Status: DC

## 2022-10-28 MED ORDER — MIDAZOLAM HCL 5 MG/5ML IJ SOLN
INTRAMUSCULAR | Status: AC
  Filled 2022-10-28: qty 5

## 2022-10-28 MED ORDER — HEPARIN SODIUM (PORCINE) 1000 UNIT/ML IJ SOLN
INTRAMUSCULAR | Status: DC | PRN
  Administered 2022-10-28: 4000 [IU] via INTRAVENOUS

## 2022-10-28 MED ORDER — HEPARIN SOD (PORK) LOCK FLUSH 100 UNIT/ML IV SOLN
INTRAVENOUS | Status: AC
  Filled 2022-10-28: qty 5

## 2022-10-28 MED ORDER — CEFAZOLIN SODIUM-DEXTROSE 2-4 GM/100ML-% IV SOLN
2.0000 g | INTRAVENOUS | Status: AC
  Administered 2022-10-28: 2 g via INTRAVENOUS

## 2022-10-28 MED ORDER — FENTANYL CITRATE (PF) 100 MCG/2ML IJ SOLN
INTRAMUSCULAR | Status: DC | PRN
  Administered 2022-10-28 (×2): 25 ug via INTRAVENOUS
  Administered 2022-10-28: 50 ug via INTRAVENOUS

## 2022-10-28 MED ORDER — IOHEXOL 350 MG/ML SOLN
75.0000 mL | Freq: Once | INTRAVENOUS | Status: AC | PRN
  Administered 2022-10-28: 75 mL via INTRAVENOUS

## 2022-10-28 MED ORDER — FENTANYL CITRATE (PF) 100 MCG/2ML IJ SOLN: 12.5000 ug | Freq: Once | INTRAMUSCULAR | Status: DC | PRN

## 2022-10-28 MED ORDER — IPRATROPIUM-ALBUTEROL 0.5-2.5 (3) MG/3ML IN SOLN: 3.0000 mL | Freq: Four times a day (QID) | RESPIRATORY_TRACT | Status: DC | PRN

## 2022-10-28 MED ORDER — OXYCODONE HCL 5 MG PO TABS
5.0000 mg | ORAL_TABLET | ORAL | Status: DC | PRN
  Administered 2022-10-28 – 2022-10-30 (×7): 5 mg via ORAL
  Filled 2022-10-28 (×7): qty 1

## 2022-10-28 MED ORDER — ONDANSETRON HCL 4 MG/2ML IJ SOLN: 4.0000 mg | Freq: Four times a day (QID) | INTRAMUSCULAR | Status: DC | PRN

## 2022-10-28 MED ORDER — HEPARIN (PORCINE) 25000 UT/250ML-% IV SOLN
1850.0000 [IU]/h | INTRAVENOUS | Status: AC
  Administered 2022-10-28: 1700 [IU]/h via INTRAVENOUS
  Administered 2022-10-29: 1850 [IU]/h via INTRAVENOUS
  Filled 2022-10-28 (×2): qty 250

## 2022-10-28 MED ORDER — MIDAZOLAM HCL 2 MG/ML PO SYRP: 8.0000 mg | ORAL_SOLUTION | Freq: Once | ORAL | Status: DC | PRN

## 2022-10-28 MED ORDER — CEFAZOLIN SODIUM-DEXTROSE 2-4 GM/100ML-% IV SOLN
INTRAVENOUS | Status: AC
  Filled 2022-10-28: qty 100

## 2022-10-28 MED ORDER — LIDOCAINE HCL 1 % IJ SOLN
10.0000 mL | Freq: Once | INTRAMUSCULAR | Status: AC
  Administered 2022-10-28: 10 mL via INTRADERMAL
  Filled 2022-10-28: qty 10

## 2022-10-28 MED ORDER — METHYLPREDNISOLONE SODIUM SUCC 125 MG IJ SOLR: 125.0000 mg | Freq: Once | INTRAMUSCULAR | Status: DC | PRN

## 2022-10-28 MED ORDER — MIDAZOLAM HCL 2 MG/2ML IJ SOLN
INTRAMUSCULAR | Status: AC
  Filled 2022-10-28: qty 2

## 2022-10-28 MED ORDER — IODIXANOL 320 MG/ML IV SOLN
INTRAVENOUS | Status: DC | PRN
  Administered 2022-10-28: 50 mL via INTRAVENOUS

## 2022-10-28 MED ORDER — HYDROMORPHONE HCL 1 MG/ML IJ SOLN
1.0000 mg | INTRAMUSCULAR | Status: AC | PRN
  Administered 2022-10-28 (×2): 1 mg via INTRAVENOUS
  Filled 2022-10-28 (×2): qty 1

## 2022-10-28 MED ORDER — FAMOTIDINE 20 MG PO TABS: 40.0000 mg | ORAL_TABLET | Freq: Once | ORAL | Status: DC | PRN

## 2022-10-28 SURGICAL SUPPLY — 14 items
BALLN ARMADA 10X80X80 (BALLOONS) ×2
BALLOON ARMADA 10X80X80 (BALLOONS) IMPLANT
CANISTER PENUMBRA ENGINE (MISCELLANEOUS) IMPLANT
CATH LIGHTNI FLASH 16XTORQ 100 (CATHETERS) IMPLANT
CATH LIGHTNING FLASH XTORQ 100 (CATHETERS) ×2
CLOSURE PERCLOSE PROSTYLE (VASCULAR PRODUCTS) IMPLANT
GLIDEWIRE ADV .035X260CM (WIRE) IMPLANT
KIT ENCORE 26 ADVANTAGE (KITS) IMPLANT
KIT MICROPUNCTURE NIT STIFF (SHEATH) IMPLANT
KIT POP OPTION ELITE FILTER (Filter) IMPLANT
PACK ANGIOGRAPHY (CUSTOM PROCEDURE TRAY) ×2 IMPLANT
SHEATH BRITE TIP 6FRX11 (SHEATH) IMPLANT
SHEATH INTRO CHECKFLO 16F 13 (SHEATH) IMPLANT
WIRE GUIDERIGHT .035X150 (WIRE) IMPLANT

## 2022-10-28 NOTE — Plan of Care (Signed)

## 2022-10-28 NOTE — Progress Notes (Addendum)
  Progress Note   Patient: Tommy [name] ZOX:[id number] DOB: Nov 07, 1950 DOA: 10/27/2022     1 DOS: the patient was seen and examined on 10/28/2022   Brief hospital course: [name] is a 72 year old male with history of hyperlipidemia, tobacco use, hypertension, who presents to emergency department for chief concerns of right leg pain and swelling for 1 month. He is found to have right leg DVT, leukocytosis. Vascular surgery, oncology services consulted. Bone marrow biopsy, thrombectomy procedures scheduled for today.  Assessment and Plan: * Leukocytosis High WBC with 10%blasts. Hematology consult appreciated. He has been to IR for bone marrow biopsy, tolerated procedure well. Patient will need oncology follow up as outpatient.  Right leg DVT (HCC) Extensive occlusive acute DVT in major deep veins in the right lower extremity Continue Heparin drip as per pharmacy protocol. Vascular surgery consult appreciated, plan for thrombectomy and IVC filter placement today. He will need oral anticoagulation for 6 months as outpatient.  Essential hypertension BP stable. Continue Metoprolol. IV hydralazine PRN.  Current smoker Former tobacco user, patient quit in November 2023. Risks of tobacco explained. Does not want to smoke anymore.  H/O coronary artery bypass surgery Denies chest pain or shortness of breath. Continue beta blocker, statin.  DVT prophylaxis - on heparin drip.   Subjective: Patient is seen and examined today morning. He has right calf pain. Pain better with dilaudid. Knows about vascular intervention today. Wife at bedside asked about discharge plan.   Physical Exam: Vitals:   10/28/22 1157 10/28/22 1202 10/28/22 1207 10/28/22 1215  BP: 114/78 122/66 122/66 110/72  Pulse: 87 87 (!) 0 75  Resp: 20 (!) 22  17  Temp:      TempSrc:      SpO2: 91% 94%  (!) 88%  Weight:      Height:       General - elderly Caucasian male, no apparent  distress. Lungs - no wheezes, rhochi or rales. Heart - S1, S2 hears, no murmurs. Extremities- no pedal edema. Right calf tenderness present. Neuro- AAOX3, non focal exam.  Data Reviewed:  CBC, BMP. PT/APTT  Family Communication: Patient and wife understand and agree with discharge plan.  Disposition: Status is: Inpatient Remains inpatient appropriate because: IV heparin drip, vascular intervention for DVT.  Planned Discharge Destination: Home    MDM level 3- Patient has large right leg DVT, need heparin drip with close monitoring of coags, hemodynamics. Need vascular procedure. He is at high risk for sudden clinical deterioration.  Author: Marcelino Duster, MD 10/28/2022 12:29 PM  For on call review [email or web address].

## 2022-10-28 NOTE — Procedures (Signed)
Interventional Radiology Procedure Note  Date of Procedure: 10/28/2022  Procedure: BMBx   Findings:  1. BMBx right ilium    Complications: No immediate complications noted.   Estimated Blood Loss: minimal  Follow-up and Recommendations: 1. Bedrest 1 hour    Olive Bass, MD  Vascular & Interventional Radiology  10/28/2022 10:39 AM

## 2022-10-28 NOTE — Progress Notes (Signed)
Pt unable to make [facility]

## 2022-10-28 NOTE — Consult Note (Signed)
ANTICOAGULATION CONSULT NOTE  Pharmacy Consult for heparin infusion Indication: DVT  No Known Allergies  Patient Measurements: Height: 5\' 10"  (177.8 cm) Weight: 77 kg (169 lb 12.1 oz) IBW/kg (Calculated) : 73 Heparin Dosing Weight: 77 kg  Vital Signs: Temp: 97.8 F (36.6 C) (05/31 2020) Temp Source: Oral (05/31 2020) BP: 109/73 (05/31 2020) Pulse Rate: 84 (05/31 2020)  Labs: Recent Labs    10/27/22 0857 10/27/22 1219 10/27/22 2023 10/28/22 0456 10/28/22 1942  HGB 15.0  --   --  13.3  --   HCT 53.3*  --   --  46.1  --   PLT 94*  --   --  84*  --   APTT  --  37*  --   --   --   LABPROT  --  15.4*  --  16.4*  --   INR  --  1.2  --  1.3*  --   HEPARINUNFRC  --   --  0.13* 0.19* 0.30  CREATININE 1.37*  --   --  1.17  --   CKTOTAL 24*  --   --   --   --      Estimated Creatinine Clearance: 60.7 mL/min (by C-G formula based on SCr of 1.17 mg/dL).   Medical History: Past Medical History:  Diagnosis Date   Coronary artery disease    Renal disorder     Medications:  No home anticoagulants per pharmacist review  Assessment: 56 you male presented to ED due to right leg swelling.  Imaging revealed right lower extremity DVT.  Pharmacy consulted to initiate heparin infusion.  Goal of Therapy:  Heparin level 0.3-0.7 units/ml Monitor platelets by anticoagulation protocol: Yes   Plan:  Heparin subtherapeutic (0.30)  continue heparin infusion at 1700 units/hr Check anti-Xa level in 8 to confirm Continue to monitor H&H and platelets  Sharen Hones, PharmD, BCPS Clinical Pharmacist   10/28/2022 9:13 PM

## 2022-10-28 NOTE — Consult Note (Addendum)
ANTICOAGULATION CONSULT NOTE  Pharmacy Consult for heparin infusion Indication: DVT  No Known Allergies  Patient Measurements: Height: 5\' 10"  (177.8 cm) Weight: 77 kg (169 lb 12.1 oz) IBW/kg (Calculated) : 73 Heparin Dosing Weight: 77 kg  Vital Signs: Temp: 99.4 F (37.4 C) (05/31 0415) Temp Source: Oral (05/31 0415) BP: 104/60 (05/31 0444) Pulse Rate: 81 (05/31 0444)  Labs: Recent Labs    10/27/22 0857 10/27/22 1219 10/27/22 2023 10/28/22 0456  HGB 15.0  --   --  13.3  HCT 53.3*  --   --  46.1  PLT 94*  --   --  84*  APTT  --  37*  --   --   LABPROT  --  15.4*  --  16.4*  INR  --  1.2  --  1.3*  HEPARINUNFRC  --   --  0.13* 0.19*  CREATININE 1.37*  --   --  1.17  CKTOTAL 24*  --   --   --      Estimated Creatinine Clearance: 60.7 mL/min (by C-G formula based on SCr of 1.17 mg/dL).   Medical History: Past Medical History:  Diagnosis Date   Coronary artery disease    Renal disorder     Medications:  No home anticoagulants per pharmacist review  Assessment: 63 you male presented to ED due to right leg swelling.  Imaging revealed right lower extremity DVT.  Pharmacy consulted to initiate heparin infusion.  Goal of Therapy:  Heparin level 0.3-0.7 units/ml Monitor platelets by anticoagulation protocol: Yes   Plan:  Heparin subtherapeutic (0.19)  Give 2300 units bolus x 1 Increase heparin infusion to 1700 units/hr Check anti-Xa level in 8 hours following rate change Continue to monitor H&H and platelets  Otelia Sergeant, PharmD, [facility] 10/28/2022 6:22 AM

## 2022-10-28 NOTE — Op Note (Signed)
[facility]   OPERATIVE NOTE   PRE-OPERATIVE DIAGNOSIS: extensive RLE DVT  POST-OPERATIVE DIAGNOSIS: same   PROCEDURE: 1.   US guidance for vascular access to the right popliteal vein 2.   Catheter placement into the right iliac vein and the IVC from right popliteal approach 3.   IVC gram and right lower extremity venogram 4.   IVC filter placement 5.   Mechanical thrombectomy to right popliteal, superficial femoral, common femoral, and external iliac vein 6.   PTA of right external iliac vein with 10 mm balloon    SURGEON: Festus Barren, MD  ASSISTANT(S): none  ANESTHESIA: local with moderate conscious sedation for 32 minutes using 4 mg of Versed and 100 mcg of Fentanyl  ESTIMATED BLOOD LOSS: 300 cc  FINDING(S): 1.  Extensive thrombus from the right popliteal vein up to the right external iliac vein.  Narrowing seen in the right external iliac vein after thrombectomy treated with angioplasty.  IVC was patent.  SPECIMEN(S):  none  INDICATIONS:    Patient is a 72 y.o. male who presents with extensive right leg DVT.  Patient has marked leg swelling and pain.  Venous intervention is performed to reduce the symtpoms and avoid long term postphlebitic symptoms.    DESCRIPTION: After obtaining full informed written consent, the patient was brought back to the vascular suite and placed supine upon the table. Moderate conscious sedation was administered during a face to face encounter with the patient throughout the procedure with my supervision of the RN administering medicines and monitoring the patient's vital signs, pulse oximetry, telemetry and mental status throughout from the start of the procedure until the patient was taken to the recovery room.  After obtaining adequate anesthesia, the patient was prepped and draped in the standard fashion.    The patient was then placed into the prone position.  The right popliteal vein was then accessed under direct  ultrasound guidance without difficulty with a micropuncture needle and a permanent image was recorded.  I then placed a 6 French sheath and placed a ProGlide device in a preclose fashion.  I then upsized to an 16 Fr sheath over a J wire.  3000 units of heparin were then given.  Imaging showed extensive DVT with minimal flow.  A Kumpe catheter and Magic tourque wire were then advanced into the CFV and images were performed.  There is thrombus up into the external iliac vein but the common iliac vein was patent.  I was able to cross the thrombus and stenosis and advance into the IVC which was patent.  The level of the renal veins were at L1.  I then deployed the IVC filter at the level of L2.  This was an option Elite IVC filter made for long length delivered from the popliteal vein.  The delivery sheath was then removed.  I then used the Penumbra Cat 16 lightening/flash catheter and evacuated about 250 to 300 cc of effluent with mechanical thrombectomy throughout the external iliac veins, CFV, SFV, and popliteal.  This had marked improvement.  There is essentially no residual thrombus seen after this.  There was narrowing in the right external iliac vein what appeared to be 70 to 80%.  I then selected a 10 mm diameter by 8 cm length angioplasty balloon advance this in the right external iliac artery over the Terumo advantage wire.  This was inflated to 8 atm for 1 minute.  Completion imaging showed about a 30 to 40% residual  stenosis but this is markedly improved.  I then elected to terminate the procedure.  The sheath was removed and the Pro-glide device was secured.  A Monocryl suture was then placed at the access site.  A dressing was placed.  She was taken to the recovery room in stable condition having tolerated the procedure well.    COMPLICATIONS: None  CONDITION: Stable  Festus Barren 10/28/2022 12:07 PM

## 2022-10-28 NOTE — Interval H&P Note (Signed)
History and Physical Interval Note:  10/28/2022 11:16 AM  Tommy [name]  has presented today for surgery, with the diagnosis of Right leg DVT.  The various methods of treatment have been discussed with the patient and family. After consideration of risks, benefits and other options for treatment, the patient has consented to  Procedure(s): PERIPHERAL VASCULAR THROMBECTOMY (Right) as a surgical intervention.  The patient's history has been reviewed, patient examined, no change in status, stable for surgery.  I have reviewed the patient's chart and labs.  Questions were answered to the patient's satisfaction.     Festus Barren

## 2022-10-28 NOTE — Progress Notes (Signed)
Due to increased oxygen demand, I ordered CTA chest. Got call for Radiology regarding abnormal CTA chest - right lower segment PE, left upper lobe  pneumonia, emphysematous changes. Discussed with patient regarding the results. Has cough, hypoxia. Ordered duonebs, continue heparin drip. Will start Rocephin, Azithromycin.

## 2022-10-29 DIAGNOSIS — F17200 Nicotine dependence, unspecified, uncomplicated: Secondary | ICD-10-CM

## 2022-10-29 DIAGNOSIS — I472 Ventricular tachycardia, unspecified: Secondary | ICD-10-CM

## 2022-10-29 DIAGNOSIS — I2699 Other pulmonary embolism without acute cor pulmonale: Secondary | ICD-10-CM

## 2022-10-29 LAB — BASIC METABOLIC PANEL
Anion gap: 8 (ref 5–15)
BUN: 13 mg/dL (ref 8–23)
CO2: 20 mmol/L — ABNORMAL LOW (ref 22–32)
Calcium: 8 mg/dL — ABNORMAL LOW (ref 8.9–10.3)
Chloride: 106 mmol/L (ref 98–111)
Creatinine, Ser: 1.21 mg/dL (ref 0.61–1.24)
GFR, Estimated: >60 mL/min (ref >60)
Glucose, Bld: 147 mg/dL — ABNORMAL HIGH (ref 70–99)
Potassium: 3.7 mmol/L (ref 3.5–5.1)
Sodium: 134 mmol/L — ABNORMAL LOW (ref 135–145)

## 2022-10-29 LAB — CBC
HCT: 43.6 % (ref 39.0–52.0)
Hemoglobin: 12.4 g/dL — ABNORMAL LOW (ref 13.0–17.0)
MCH: 23.8 pg — ABNORMAL LOW (ref 26.0–34.0)
MCHC: 28.4 g/dL — ABNORMAL LOW (ref 30.0–36.0)
MCV: 83.5 fL (ref 80.0–100.0)
Platelets: 85 10*3/uL — ABNORMAL LOW (ref 150–400)
RBC: 5.22 MIL/uL (ref 4.22–5.81)
RDW: 19.1 % — ABNORMAL HIGH (ref 11.5–15.5)
WBC: 42.7 10*3/uL — ABNORMAL HIGH (ref 4.0–10.5)
nRBC: 1.4 % — ABNORMAL HIGH (ref 0.0–0.2)

## 2022-10-29 LAB — HEPARIN LEVEL (UNFRACTIONATED)
Heparin Unfractionated: 0.27 IU/mL — ABNORMAL LOW (ref 0.30–0.70)
Heparin Unfractionated: 0.46 IU/mL (ref 0.30–0.70)

## 2022-10-29 LAB — MAGNESIUM: Magnesium: 2 mg/dL (ref 1.7–2.4)

## 2022-10-29 LAB — PHOSPHORUS: Phosphorus: 3.8 mg/dL (ref 2.5–4.6)

## 2022-10-29 MED ORDER — MORPHINE SULFATE (PF) 4 MG/ML IV SOLN
4.0000 mg | INTRAVENOUS | Status: DC | PRN
  Administered 2022-10-29 – 2022-10-30 (×5): 4 mg via INTRAVENOUS
  Filled 2022-10-29 (×5): qty 1

## 2022-10-29 MED ORDER — HEPARIN BOLUS VIA INFUSION
1200.0000 [IU] | Freq: Once | INTRAVENOUS | Status: AC
  Administered 2022-10-29: 1200 [IU] via INTRAVENOUS
  Filled 2022-10-29: qty 1200

## 2022-10-29 MED ORDER — APIXABAN 5 MG PO TABS
10.0000 mg | ORAL_TABLET | Freq: Two times a day (BID) | ORAL | Status: DC
  Administered 2022-10-29 – 2022-10-30 (×2): 10 mg via ORAL
  Filled 2022-10-29 (×2): qty 2

## 2022-10-29 MED ORDER — METOPROLOL TARTRATE 25 MG PO TABS
25.0000 mg | ORAL_TABLET | Freq: Two times a day (BID) | ORAL | Status: DC
  Administered 2022-10-29 – 2022-10-30 (×3): 25 mg via ORAL
  Filled 2022-10-29 (×3): qty 1

## 2022-10-29 MED ORDER — APIXABAN 5 MG PO TABS: 5.0000 mg | ORAL_TABLET | Freq: Two times a day (BID) | ORAL | Status: DC

## 2022-10-29 MED ORDER — BACITRACIN ZINC 500 UNIT/GM EX OINT
TOPICAL_OINTMENT | Freq: Two times a day (BID) | CUTANEOUS | Status: DC
  Administered 2022-10-29 – 2022-10-30 (×3): 1 via TOPICAL
  Filled 2022-10-29 (×3): qty 0.9

## 2022-10-29 NOTE — Progress Notes (Signed)
       CROSS COVER NOTE  NAME: Tommy [name] MRN: [id number] DOB : 11/29/1950    Concern from nurse Eyvonne Mechanic   FYI Pt had 4beat run of Vtach, and shortly after he had another 7 beat run of Vtach. Pt was asleep, when woken up he denied chest pain, SOB, palpitations, dizziness, VS stable. Thank you      Pertinent findings on chart review:   Assessment and  Interventions   Assessment: V. tach, nonsustained  Plan: Check potassium and magnesium and replete to K over 4 and mag over 2 Will give morning dose of metoprolol now X

## 2022-10-29 NOTE — Consult Note (Signed)
ANTICOAGULATION CONSULT NOTE  Pharmacy Consult for heparin infusion Indication: DVT  No Known Allergies  Patient Measurements: Height: 5\' 10"  (177.8 cm) Weight: 77 kg (169 lb 12.1 oz) IBW/kg (Calculated) : 73 Heparin Dosing Weight: 77 kg  Vital Signs: Temp: 98.7 F (37.1 C) (06/01 0340) Temp Source: Oral (06/01 0340) BP: 104/61 (06/01 0340) Pulse Rate: 76 (06/01 0340)  Labs: Recent Labs    10/27/22 0857 10/27/22 1219 10/27/22 2023 10/28/22 0456 10/28/22 1942 10/29/22 0508  HGB 15.0  --   --  13.3  --  12.4*  HCT 53.3*  --   --  46.1  --  43.6  PLT 94*  --   --  84*  --  85*  APTT  --  37*  --   --   --   --   LABPROT  --  15.4*  --  16.4*  --   --   INR  --  1.2  --  1.3*  --   --   HEPARINUNFRC  --   --    < > 0.19* 0.30 0.27*  CREATININE 1.37*  --   --  1.17  --   --   CKTOTAL 24*  --   --   --   --   --    < > = values in this interval not displayed.     Estimated Creatinine Clearance: 60.7 mL/min (by C-G formula based on SCr of 1.17 mg/dL).   Medical History: Past Medical History:  Diagnosis Date   Coronary artery disease    Renal disorder     Medications:  No home anticoagulants per pharmacist review  Assessment: 48 you male presented to ED due to right leg swelling.  Imaging revealed right lower extremity DVT.  Pharmacy consulted to initiate heparin infusion.  Goal of Therapy:  Heparin level 0.3-0.7 units/ml Monitor platelets by anticoagulation protocol: Yes   Plan:  Heparin subtherapeutic (0.27)  Bolus 1200 units x 1 Increase heparin infusion to 1850 units/hr Recheck anti-Xa level in 8 hr after rate change Continue to monitor H&H and platelets  Otelia Sergeant, PharmD, [facility] 10/29/2022 6:30 AM

## 2022-10-29 NOTE — Progress Notes (Signed)
OT Cancellation Note  Patient Details Name: Tommy [name] MRN: [id number] DOB: 08/18/1950   Cancelled Treatment:    Reason Eval/Treat Not Completed: Medical issues which prohibited therapy. OT order received. Chart reviewed. Pt noted to be on Heparin infusion in setting of acute PE, initiated at 12:30 on 10/28/22. Per facility protocols, pt is contraindicated for OT services until 48 hours after heparin initiation. Will hold at this time and initiate services as pt medically appropriate.   Rockney Ghee, M.S., OTR/L 10/29/22, 9:25 AM

## 2022-10-29 NOTE — Progress Notes (Addendum)
  Progress Note   Patient: Tommy [name] ZOX:[id number] DOB: 1951-01-05 DOA: 10/27/2022     2 DOS: the patient was seen and examined on 10/29/2022   Brief hospital course: [name] is a 72 year old male with history of hyperlipidemia, tobacco use, hypertension, who presents to emergency department for chief concerns of right leg pain and swelling for 1 month. He is found to have right leg DVT, leukocytosis. Vascular surgery, oncology services consulted. Bone marrow biopsy, thrombectomy procedures done 10/18/22  Assessment and Plan: * Leukocytosis High WBC with 10%blasts. WBC 42 today. S/p bone marrow biopsy, tolerated procedure well. Patient will need oncology follow up as outpatient. He has right upper lobe infiltrate, started rocephin and zithromax.  Right leg DVT (HCC) Acute right lower pulmonary embolism Extensive occlusive acute DVT in major deep veins in the right lower extremity. CTA chest showed Acute pulmonary embolus is noted in peripheral lower lobe branches of the right pulmonary artery. Right upper lobe opacity, 4mm nodule. S/p thrombectomy and IVC filter placement yesterday. Vascular team follow up appreciated, dressing to be removed, out of bed. Continue Heparin drip as per pharmacy protocol. Will transition to Eliquis. He will need oral anticoagulation for 6 months as outpatient. Continue Rocephin, Azithromycin.  Essential hypertension BP stable. Continue Metoprolol. IV hydralazine PRN.  Current smoker Former tobacco user, patient quit in November 2023. Risks of tobacco explained. Does not want to smoke anymore.  H/O coronary artery bypass surgery Denies chest pain or shortness of breath. Continue beta blocker, statin.  DVT prophylaxis - on heparin drip.   Subjective: Patient is seen and examined today morning. Had few beats of Vtach on tele last night. He has pain, asks for morphine. Asks if right leg dressing can come off. Eating fair.  Encouraged out of bed.  Physical Exam: Vitals:   10/29/22 0340 10/29/22 0738 10/29/22 0848 10/29/22 1152  BP: 104/61 113/63 103/62 (!) 102/58  Pulse: 76 70 72 69  Resp: 18 16  16   Temp: 98.7 F (37.1 C) 97.7 F (36.5 C)  97.8 F (36.6 C)  TempSrc: Oral Oral  Oral  SpO2: 95% 99%  95%  Weight:      Height:       General - elderly Caucasian male, no apparent distress. Lungs - no wheezes, rhochi or rales. Heart - S1, S2 hears, no murmurs. Extremities- no pedal edema. Right calf tenderness present. Neuro- AAOX3, non focal exam.  Data Reviewed:  CBC, BMP. PT/APTT  Family Communication: Patient and wife understand and agree with discharge plan.  Disposition: Status is: Inpatient Remains inpatient appropriate because: IV heparin drip, vascular intervention for DVT.  Planned Discharge Destination: Home    MDM level 3- Patient has large right leg DVT, need heparin drip with close monitoring of coags, hemodynamics. Post vascular procedure. He is at high risk for sudden clinical deterioration.  Author: Marcelino Duster, MD 10/29/2022 1:02 PM  For on call review [email or web address].

## 2022-10-29 NOTE — Progress Notes (Signed)
1 Day Post-Op   Subjective/Chief Complaint: Doing OK. Major complaint of tight dressing on leg   Objective: Vital signs in last 24 hours: Temp:  [97.7 F (36.5 C)-98.7 F (37.1 C)] 97.7 F (36.5 C) (06/01 0738) Pulse Rate:  [0-87] 72 (06/01 0848) Resp:  [16-22] 16 (06/01 0738) BP: (96-122)/(61-79) 103/62 (06/01 0848) SpO2:  [88 %-99 %] 99 % (06/01 0738) Last BM Date : 10/27/22  Intake/Output from previous day: 05/31 0701 - 06/01 0700 In: 1654.2 [I.V.:1554.2; IV Piggyback:100] Out: 1175 [Urine:900; Blood:275] Intake/Output this shift: Total I/O In: 240 [P.O.:240] Out: 500 [Urine:500]  General appearance: alert and no distress Extremities: mild edema, soft, access site- Clean, small skin abrasion- patient states he had some type of heating pad on the leg prior to admission causing a superficial burn  Lab Results:  Recent Labs    10/28/22 0456 10/29/22 0508  WBC 46.0* 42.7*  HGB 13.3 12.4*  HCT 46.1 43.6  PLT 84* 85*   BMET Recent Labs    10/28/22 0456 10/29/22 1054  NA 137 134*  K 3.8 3.7  CL 109 106  CO2 20* 20*  GLUCOSE 95 147*  BUN 17 13  CREATININE 1.17 1.21  CALCIUM 7.8* 8.0*   PT/INR Recent Labs    10/27/22 1219 10/28/22 0456  LABPROT 15.4* 16.4*  INR 1.2 1.3*   ABG No results for input(s): "PHART", "HCO3" in the last 72 hours.  Invalid input(s): "PCO2", "PO2"  Studies/Results: CT Angio Chest Pulmonary Embolism (PE) W or WO Contrast  Result Date: 10/28/2022 CLINICAL DATA:  Hypoxia, history of deep venous thrombosis. EXAM: CT ANGIOGRAPHY CHEST WITH CONTRAST TECHNIQUE: Multidetector CT imaging of the chest was performed using the standard protocol during bolus administration of intravenous contrast. Multiplanar CT image reconstructions and MIPs were obtained to evaluate the vascular anatomy. RADIATION DOSE REDUCTION: This exam was performed according to the departmental dose-optimization program which includes automated exposure control,  adjustment of the mA and/or kV according to patient size and/or use of iterative reconstruction technique. CONTRAST:  75mL OMNIPAQUE IOHEXOL 350 MG/ML SOLN COMPARISON:  None Available. FINDINGS: Cardiovascular: Filling defect is noted in several peripheral lower lobe branches of right pulmonary artery consistent with pulmonary embolus. Mild cardiomegaly. Status post coronary bypass graft. No pericardial effusion. Mediastinum/Nodes: No enlarged mediastinal, hilar, or axillary lymph nodes. Thyroid gland, trachea, and esophagus demonstrate no significant findings. Lungs/Pleura: No pneumothorax is noted. Emphysematous disease is noted throughout both lungs. Right upper lobe airspace opacity is noted concerning for pneumonia. Minimal right posterior basilar subsegmental atelectasis is noted. Large calcified granuloma is noted in left posterior costophrenic sulcus. 4 mm right upper lobe nodule is noted best seen on image number 69 of series 5. Upper Abdomen: Mild splenomegaly is noted. Musculoskeletal: No chest wall abnormality. No acute or significant osseous findings. Review of the MIP images confirms the above findings. IMPRESSION: Acute pulmonary embolus is noted in peripheral lower lobe branches of the right pulmonary artery. Critical Value/emergent results were called by telephone at the time of interpretation on 10/28/2022 at 2:50 pm to provider [facility] , who verbally acknowledged these results. Right upper lobe airspace opacity is noted concerning for possible pneumonia. 4 mm nodule noted in right upper lobe. Although likely benign, if the patient is high-risk, given the morphology and/or location of this nodule a non-contrast chest CT can be considered in 12 months.This recommendation follows the consensus statement: Guidelines for Management of Incidental Pulmonary Nodules Detected on CT Images: From the Fleischner Society 2017;  Radiology 2017; [id number]. Status post coronary artery bypass graft.  Mild splenomegaly. Aortic Atherosclerosis (ICD10-I70.0) and Emphysema (ICD10-J43.9). Electronically Signed   By: Lupita Raider M.D.   On: 10/28/2022 14:51   IR BONE MARROW BIOPSY & ASPIRATION  Result Date: 10/28/2022 INDICATION: Leukocytosis EXAM: Bone marrow aspiration and core biopsy using fluoroscopic guidance MEDICATIONS: None. ANESTHESIA/SEDATION: Moderate (conscious) sedation was employed during this procedure. A total of Versed 1 mg and Fentanyl 100 mcg was administered intravenously. Moderate Sedation Time: 10 minutes. The patient's level of consciousness and vital signs were monitored continuously by radiology nursing throughout the procedure under my direct supervision. FLUOROSCOPY TIME:  Fluoroscopy Time: 0.6 minutes (5 mGy) COMPLICATIONS: None immediate. PROCEDURE: Informed written consent was obtained from the patient after a thorough discussion of the procedural risks, benefits and alternatives. All questions were addressed. Maximal Sterile Barrier Technique was utilized including caps, mask, sterile gowns, sterile gloves, sterile drape, hand hygiene and skin antiseptic. A timeout was performed prior to the initiation of the procedure. The patient was placed prone on the IR exam table. Limited fluoroscopy of the pelvis was performed for planning purposes. Skin entry site was marked, and the overlying skin was prepped and draped in the standard sterile fashion. Local analgesia was obtained with 1% lidocaine. Using fluoroscopic guidance, an 11 gauge needle was advanced just deep to the cortex of the right posterior ilium. Subsequently, bone marrow aspiration and core biopsy were performed. Specimens were submitted to lab/pathology for handling. Hemostasis was achieved with manual pressure, and a clean dressing was placed. The patient tolerated the procedure well without immediate complication. IMPRESSION: Successful bone marrow aspiration and core biopsy of the right posterior ilium. Electronically  Signed   By: Olive Bass M.D.   On: 10/28/2022 13:17   PERIPHERAL VASCULAR CATHETERIZATION  Result Date: 10/28/2022 See surgical note for result.  US Venous Img Lower Right (DVT Study)  Result Date: 10/27/2022 CLINICAL DATA:  Calf pain EXAM: Right LOWER EXTREMITY VENOUS DOPPLER ULTRASOUND TECHNIQUE: Gray-scale sonography with compression, as well as color and duplex ultrasound, were performed to evaluate the deep venous system(s) from the level of the common femoral vein through the popliteal and proximal calf veins. COMPARISON:  None Available. FINDINGS: VENOUS Extensive acute occlusive DVT is noted in right lower extremity involving common femoral, femoral, popliteal, posterior tibial and peroneal veins. Limited views of the contralateral common femoral vein are unremarkable. OTHER None. Limitations: none IMPRESSION: Extensive occlusive acute DVT is noted in major deep veins in right lower extremity. Electronically Signed   By: Ernie Avena M.D.   On: 10/27/2022 12:56    Anti-infectives: Anti-infectives (From admission, onward)    Start     Dose/Rate Route Frequency Ordered Stop   10/28/22 1700  cefTRIAXone (ROCEPHIN) 1 g in sodium chloride 0.9 % 100 mL IVPB        1 g 200 mL/hr over 30 Minutes Intravenous Every 24 hours 10/28/22 1521 11/02/22 1659   10/28/22 1615  azithromycin (ZITHROMAX) tablet 500 mg        500 mg Oral Daily 10/28/22 1521 11/02/22 0959   10/28/22 1137  ceFAZolin (ANCEF) IVPB 2g/100 mL premix        2 g 200 mL/hr over 30 Minutes Intravenous 30 min pre-op 10/28/22 1137 10/28/22 1227       Assessment/Plan: s/p Procedure(s): PERIPHERAL VASCULAR THROMBECTOMY (Right) IVC FILTER INSERTION (N/A) POD #1 Bacitracin to skin abrasion OK for OOB Eliquis OK for DC once otherwise Medically statble  LOS: 2  days    Bertram Denver 10/29/2022

## 2022-10-29 NOTE — Evaluation (Signed)
Physical Therapy Evaluation Patient Details Name: Tommy [name] MRN: [id number] DOB: 02/13/1951 Today's Date: 10/29/2022  History of Present Illness  presented to ER secondary to progressive R LE pain, edema; admitted for management of extensive, acute R LE DVT (s/p thrombectomy and IVC filter placement 10/28/22) and acute PE (managed with heparin drip, started 10/27/22).  Per attending, planning for upcoming transition to eliquis; cleared for ambulation at current heparin levels.  Medical work up also revealing for leukocytosis, s/p bone marrow biopsy (10/28/22); results pending.  Clinical Impression  Patient resting in bed upon arrival to room; wife at bedside.  Patient alert and oriented, follows commands, eager for OOB efforts.  Endorses R LE pain 6/10.  Mild strength/ROM deficits to R knee/ankle related to post-procedure soreness and edema; anticipate resolution as pain control improves. Able to complete bed mobility with indep; sit/stand, basic transfers and gait (100') with RW, cga/close sup.  Demonstrates 3-point, step to gait pattern with cuing to offload R LE as needed (for pain control). Tolerates R foot flat in loading, but minimal heel strike/toe off throughout gait cycle. Slow and deliberate in gait progression, but no overt buckling or LOB. Pain from 6/10 to 8/10 with gait efforts; improved with seated rest and elevation (meds requested per RN end of session). Consistent cuing for pursed lip breathing with gait efforts, maintaining sats >90% on RA  Did educate on pursed lip breathing, positional awareness for chest expansion, use of incentive spirometer/flutter valve; patient/wife voiced understanding and awareness. Would benefit from skilled PT to address above deficits and promote optimal return to PLOF.; recommend post-acute PT follow up as indicated by interdisciplinary care team.           Recommendations for follow up therapy are one component of a multi-disciplinary discharge  planning process, led by the attending physician.  Recommendations may be updated based on patient status, additional functional criteria and insurance authorization.  Follow Up Recommendations       Assistance Recommended at Discharge PRN  Patient can return home with the following  A little help with walking and/or transfers;A little help with bathing/dressing/bathroom    Equipment Recommendations  (has RW)  Recommendations for Other Services       Functional Status Assessment Patient has had a recent decline in their functional status and demonstrates the ability to make significant improvements in function in a reasonable and predictable amount of time.     Precautions / Restrictions Precautions Precautions: Fall Restrictions Weight Bearing Restrictions: No      Mobility  Bed Mobility Overal bed mobility: Modified Independent                  Transfers Overall transfer level: Needs assistance Equipment used: Rolling walker (2 wheels) Transfers: Sit to/from Stand Sit to Stand: Supervision, Min guard                Ambulation/Gait Ambulation/Gait assistance: Min guard, Supervision Gait Distance (Feet): 100 Feet Assistive device: Rolling walker (2 wheels)         General Gait Details: 3-point, step to gait pattern with cuing to offload R LE as needed (for pain control).  Tolerates R foot flat in loading, but minimal heel strike/toe off throughout gait cycle.  Slow and deliberate in gait progression, but no overt buckling or LOB.  Pain from 6/10 to 8/10 with gait efforts; improved with seated rest and elevation (meds requested per RN end of session).  Consistent cuing for pursed lip breathing with gait efforts,  maintaining sats >90% on RA  Stairs            Wheelchair Mobility    Modified Rankin (Stroke Patients Only)       Balance Overall balance assessment: Needs assistance Sitting-balance support: No upper extremity supported, Feet  supported Sitting balance-Leahy Scale: Good     Standing balance support: Bilateral upper extremity supported Standing balance-Leahy Scale: Good                               Pertinent Vitals/Pain Pain Assessment Pain Assessment: 0-10 Pain Score: 6  Pain Location: r LE Pain Descriptors / Indicators: Aching, Sore Pain Intervention(s): Limited activity within patient's tolerance, Monitored during session, Repositioned, Patient requesting pain meds-RN notified    Home Living Family/patient expects to be discharged to:: Private residence Living Arrangements: Spouse/significant other Available Help at Discharge: Family Type of Home: House Home Access: Stairs to enter Entrance Stairs-Rails: Right Entrance Stairs-Number of Steps: 3 Alternate Level Stairs-Number of Steps: essential rooms on main level of home; likes to take baths on second level Home Layout: Two level;Able to live on main level with bedroom/bathroom Home Equipment: Rolling Walker (2 wheels)      Prior Function Prior Level of Function : Independent/Modified Independent             Mobility Comments: Indep with RW, household and community mobilization without assist device; denies fall history, no home O2.       Hand Dominance   Dominant Hand: Right    Extremity/Trunk Assessment   Upper Extremity Assessment Upper Extremity Assessment: Overall WFL for tasks assessed    Lower Extremity Assessment Lower Extremity Assessment: Overall WFL for tasks assessed (R LE knee grossly 4-/5, limited by post-op pain/soreness; mild edema throughout calf, denies sensory deficit.  Procedural site clean, dry before and after session)       Communication   Communication: No difficulties  Cognition Arousal/Alertness: Awake/alert Behavior During Therapy: WFL for tasks assessed/performed Overall Cognitive Status: Within Functional Limits for tasks assessed                                           General Comments      Exercises     Assessment/Plan    PT Assessment Patient needs continued PT services  PT Problem List Decreased activity tolerance;Decreased balance;Decreased mobility;Decreased knowledge of use of DME;Decreased safety awareness;Decreased knowledge of precautions;Cardiopulmonary status limiting activity       PT Treatment Interventions DME instruction;Gait training;Stair training;Functional mobility training;Therapeutic activities;Therapeutic exercise;Balance training;Patient/family education    PT Goals (Current goals can be found in the Care Plan section)  Acute Rehab PT Goals Patient Stated Goal: to go home! PT Goal Formulation: With patient/family Time For Goal Achievement: 11/12/22 Potential to Achieve Goals: Good    Frequency Min 3X/week     Co-evaluation               AM-PAC PT "6 Clicks" Mobility  Outcome Measure Help needed turning from your back to your side while in a flat bed without using bedrails?: None Help needed moving from lying on your back to sitting on the side of a flat bed without using bedrails?: None Help needed moving to and from a bed to a chair (including a wheelchair)?: A Little Help needed standing up from a chair using your  arms (e.g., wheelchair or bedside chair)?: A Little Help needed to walk in hospital room?: A Little Help needed climbing 3-5 steps with a railing? : A Little 6 Click Score: 20    End of Session Equipment Utilized During Treatment: Gait belt Activity Tolerance: Patient tolerated treatment well Patient left: in chair;with call bell/phone within reach;with chair alarm set;with family/visitor present Nurse Communication: Mobility status PT Visit Diagnosis: Muscle weakness (generalized) (M62.81);Difficulty in walking, not elsewhere classified (R26.2)    Time: 0981-1914 PT Time Calculation (min) (ACUTE ONLY): 28 min   Charges:   PT Evaluation $PT Eval Moderate Complexity: 1 Mod PT  Treatments $Gait Training: 8-22 mins        Lovelyn Sheeran H. Manson Passey, PT, DPT, NCS 10/29/22, 3:45 PM [phone]

## 2022-10-29 NOTE — Progress Notes (Signed)
Pt had 4 beat run of Vtach, shortly after he had another 7 beat run of Vtach. Pt aslyp, when woken up, he denied chest pain, SOB, palpitations, dizziness, mental status unchanged, VS stable. Para March, MD notified.

## 2022-10-30 ENCOUNTER — Inpatient Hospital Stay (HOSPITAL_COMMUNITY)
Admit: 2022-10-30 | Discharge: 2022-10-30 | Disposition: A | Discharge status: Home / Self Care | Payer: Medicare Other | Attending: Internal Medicine | Admitting: Internal Medicine

## 2022-10-30 DIAGNOSIS — R911 Solitary pulmonary nodule: Secondary | ICD-10-CM

## 2022-10-30 DIAGNOSIS — J189 Pneumonia, unspecified organism: Secondary | ICD-10-CM

## 2022-10-30 DIAGNOSIS — I2602 Saddle embolus of pulmonary artery with acute cor pulmonale: Secondary | ICD-10-CM | POA: Diagnosis not present

## 2022-10-30 LAB — CBC WITH DIFFERENTIAL/PLATELET
Abs Immature Granulocytes: 8.63 10*3/uL — ABNORMAL HIGH (ref 0.00–0.07)
Basophils Absolute: 0.9 10*3/uL — ABNORMAL HIGH (ref 0.0–0.1)
Basophils Relative: 2 %
Eosinophils Absolute: 0.2 10*3/uL (ref 0.0–0.5)
Eosinophils Relative: 0 %
HCT: 42.2 % (ref 39.0–52.0)
Hemoglobin: 12.3 g/dL — ABNORMAL LOW (ref 13.0–17.0)
Immature Granulocytes: 21 %
Lymphocytes Relative: 4 %
Lymphs Abs: 1.7 10*3/uL (ref 0.7–4.0)
MCH: 24.1 pg — ABNORMAL LOW (ref 26.0–34.0)
MCHC: 29.1 g/dL — ABNORMAL LOW (ref 30.0–36.0)
MCV: 82.7 fL (ref 80.0–100.0)
Monocytes Absolute: 5.2 10*3/uL — ABNORMAL HIGH (ref 0.1–1.0)
Monocytes Relative: 12 %
Neutro Abs: 25.5 10*3/uL — ABNORMAL HIGH (ref 1.7–7.7)
Neutrophils Relative %: 61 %
Platelets: 93 10*3/uL — ABNORMAL LOW (ref 150–400)
RBC: 5.1 MIL/uL (ref 4.22–5.81)
RDW: 19.3 % — ABNORMAL HIGH (ref 11.5–15.5)
Smear Review: NORMAL
WBC: 42.1 10*3/uL — ABNORMAL HIGH (ref 4.0–10.5)
nRBC: 1.1 % — ABNORMAL HIGH (ref 0.0–0.2)

## 2022-10-30 LAB — COMPREHENSIVE METABOLIC PANEL
ALT: 11 U/L (ref 0–44)
AST: 20 U/L (ref 15–41)
Albumin: 2.9 g/dL — ABNORMAL LOW (ref 3.5–5.0)
Alkaline Phosphatase: 97 U/L (ref 38–126)
Anion gap: 10 (ref 5–15)
BUN: 14 mg/dL (ref 8–23)
CO2: 19 mmol/L — ABNORMAL LOW (ref 22–32)
Calcium: 8.1 mg/dL — ABNORMAL LOW (ref 8.9–10.3)
Chloride: 107 mmol/L (ref 98–111)
Creatinine, Ser: 1.22 mg/dL (ref 0.61–1.24)
GFR, Estimated: >60 mL/min (ref >60)
Glucose, Bld: 101 mg/dL — ABNORMAL HIGH (ref 70–99)
Potassium: 3.8 mmol/L (ref 3.5–5.1)
Sodium: 136 mmol/L (ref 135–145)
Total Bilirubin: 1.4 mg/dL — ABNORMAL HIGH (ref 0.3–1.2)
Total Protein: 6.5 g/dL (ref 6.5–8.1)

## 2022-10-30 LAB — ECHOCARDIOGRAM COMPLETE: Weight: 2716.07 oz

## 2022-10-30 MED ORDER — AMOXICILLIN-POT CLAVULANATE 875-125 MG PO TABS: 1.0000 | ORAL_TABLET | Freq: Two times a day (BID) | ORAL | 0 refills | Status: AC

## 2022-10-30 MED ORDER — TRELEGY ELLIPTA 100-62.5-25 MCG/ACT IN AEPB: 1.0000 | INHALATION_SPRAY | Freq: Every day | RESPIRATORY_TRACT | 1 refills | Status: DC

## 2022-10-30 MED ORDER — APIXABAN 5 MG PO TABS: ORAL_TABLET | ORAL | 0 refills | Status: DC

## 2022-10-30 MED ORDER — OXYCODONE HCL 5 MG PO TABS: 5.0000 mg | ORAL_TABLET | Freq: Four times a day (QID) | ORAL | 0 refills | Status: DC | PRN

## 2022-10-30 MED ORDER — OXYCODONE HCL 5 MG PO TABS: 5.0000 mg | ORAL_TABLET | Freq: Four times a day (QID) | ORAL | Status: DC | PRN

## 2022-10-30 MED ORDER — ALBUTEROL SULFATE HFA 108 (90 BASE) MCG/ACT IN AERS: 2.0000 | INHALATION_SPRAY | RESPIRATORY_TRACT | 0 refills | Status: DC | PRN

## 2022-10-30 MED ORDER — MULTI-VITAMINS PO TABS: 1.0000 | ORAL_TABLET | Freq: Every day | ORAL | 3 refills | Status: DC

## 2022-10-30 NOTE — TOC Initial Note (Signed)
Transition of Care [facility]) - Initial/Assessment Note    Patient Details  Name: Tommy [name] MRN: [id number] Date of Birth: [birth year]  [facility] CM/SW Contact:    Bing Quarry, RN Phone Number: 10/30/2022, 11:03 AM  Clinical Narrative: 10/30/22: Patient admitted 10/27/22 with provider notes indicating dx of "RIGHT leg DVT, leukocytosis. Vascular surgery, oncology services consulted". IVC filter placed 10/28/22. Will need oncology follow up as outpatient. IV AB for RUL infiltrate. Changing PCP to [facility] Primary care and has confirmed appointment for 12/08/22. No medications or transportation issues verbalized in [facility] RN CM conversation. Will need DME home oxygen 3L/[place] on discharge 2/2 desaturation during therapy and subsequent oxygen ambulation test/documentation. Orders are in, [employer] notified via [facility] after speaking with patient regarding any prior oxygen at home (none) and any DME agency preference (none). Likely discharge today. Has transportation to home after oxygen transport delivered. Gabriel Cirri RN CM        Patient Goals and CMS Choice            Expected Discharge Plan and Services                                              Prior Living Arrangements/Services                       Activities of Daily Living Home Assistive Devices/Equipment: Dan Humphreys (specify type) ADL Screening (condition at time of admission) Patient's cognitive ability adequate to safely complete daily activities?: Yes Is the patient deaf or have difficulty hearing?: No Does the patient have difficulty seeing, even when wearing glasses/contacts?: No Does the patient have difficulty concentrating, remembering, or making decisions?: No Patient able to express need for assistance with ADLs?: Yes Does the patient have difficulty dressing or bathing?: No Independently performs ADLs?: Yes (appropriate for developmental age) Does the patient have difficulty walking  or climbing stairs?: Yes Weakness of Legs: Right Weakness of Arms/Hands: None  Permission Sought/Granted                  Emotional Assessment              Admission diagnosis:  Leukocytosis [D72.829] Patient Active Problem List   Diagnosis Date Noted   Acute pulmonary embolism (HCC) 10/29/2022   V-tach (HCC) 10/29/2022   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   Leukocytosis 10/27/2022   Essential hypertension 10/27/2022   Right leg DVT (HCC) 10/27/2022   Difficult airway for intubation 10/27/2022   H/O coronary artery bypass surgery 02/20/2015   Arteriosclerosis of coronary artery 01/27/2015   Blood pressure elevated 01/23/2015   Other long term (current) drug therapy 01/23/2015   Current smoker 11/04/2014   DEPRESSION/ANXIETY 08/11/2009   URI 08/11/2009   HX, PERSONAL, TOBACCO USE 12/07/2006   PCP:  Pcp, No Pharmacy:   [id number] [place], [place] - [address] AT [facility] OF [address] [place] [address] Phone: [phone] Fax: [phone]     Social Determinants of Health (SDOH) Social History: SDOH Screenings   Food Insecurity: No Food Insecurity (10/27/2022)  Housing: Low Risk  (10/27/2022)  Transportation Needs: No Transportation Needs (10/27/2022)  Utilities: Not At Risk (10/27/2022)  Tobacco Use: Medium Risk (10/28/2022)   SDOH Interventions:     Readmission Risk  Interventions    10/30/2022   10:58 AM  Readmission Risk Prevention Plan  Post Dischage Appt Complete  Medication Screening Complete  Transportation Screening Complete

## 2022-10-30 NOTE — Plan of Care (Signed)

## 2022-10-30 NOTE — Discharge Summary (Signed)
Physician Discharge Summary   Patient: Tommy [name] MRN: [id number] DOB: 1951/02/16  Admit date:     10/27/2022  Discharge date: 10/30/22  Discharge Physician: Marcelino Duster   PCP: Pcp, No   Recommendations at discharge:    PCP discharge follow up, ECHO result pending. Continue Eliquis therapy for 6 months. Heme/ onc follow up for Leukocytosis work up. Pulmonology follow up for COPD, pulmonary nodule, new PE.  Discharge Diagnoses: Principal Problem:   Leukocytosis Active Problems:   Right leg DVT (HCC)   H/O coronary artery bypass surgery   Current smoker   Essential hypertension   Difficult airway for intubation   Myelodysplastic syndrome with excess blasts (HCC)   Acute pulmonary embolism (HCC)   V-tach (HCC)  Resolved Problems:   * No resolved hospital problems. [facility] Course: Tommy [name] is a 72 year old male with history of hyperlipidemia, tobacco use, hypertension, who presents to emergency department for chief concerns of right leg pain and swelling for 1 month. He is found to have right leg DVT, leukocytosis. Vascular surgery, oncology services consulted. Bone marrow biopsy, thrombectomy procedures done 10/18/22. Heparin drip transitioned to Eliquis. PT worked with him, qualified for 3L home oxygen with ambulation. Advised PCP, hematology, pulmonology follow up. New scripts sent to pharmacy. Home oxygen deliver. Patient and his wife understand and agree with discharge plan.  Assessment and Plan: * Leukocytosis with blasts Hematology team advised bone marrow biopsy. S/p bone marrow biopsy - results pending. Follow hematology clinic as scheduled.  Right leg DVT (HCC) Acute right lower lobe PE Extensive occlusive acute DVT in major deep veins in the right lower extremity S/p thrombectomy and IVC filter placement on 10/28/2022. Continue Eliquis for 6 months.  COPD Right upper lobe infiltrate Right upper lobe 4mm nodule Complete oral  antibiotic therapy. Smoking cessation. Bronchodilatros and home inhalers. Pulmonology follow up.   Essential hypertension Continue metoprolol. Keep log of BP.  Current smoker Former tobacco user, patient quit in November 2023.  H/O coronary artery bypass surgery Continue metoprolol tartrate 25 mg p.o. twice, atorvastatin 80 mg nightly.      Pain control - [employer] Controlled Substance Reporting System database was reviewed. and patient was instructed, not to drive, operate heavy machinery, perform activities at heights, swimming or participation in water activities or provide baby-sitting services while on Pain, Sleep and Anxiety Medications; until their outpatient Physician has advised to do so again. Also recommended to not to take more than prescribed Pain, Sleep and Anxiety Medications.  Consultants: Hematology, Vascular surgery Procedures performed: Thrombectomy right leg, IVC filter.  Disposition: Home Diet recommendation:  Discharge Diet Orders (From admission, onward)     Start     Ordered   10/30/22 0000  Diet - low sodium heart healthy        10/30/22 1324           Cardiac diet DISCHARGE MEDICATION: Allergies as of 10/30/2022   No Known Allergies      Medication List     STOP taking these medications    tiZANidine 4 MG tablet Commonly known as: ZANAFLEX       TAKE these medications    albuterol 108 (90 Base) MCG/ACT inhaler Commonly known as: VENTOLIN HFA Inhale 2 puffs into the lungs every 4 (four) hours as needed for shortness of breath or wheezing.   amoxicillin-clavulanate 875-125 MG tablet Commonly known as: AUGMENTIN Take 1 tablet by mouth 2 (two) times daily for 5 days.   apixaban  5 MG Tabs tablet Commonly known as: ELIQUIS Take 2 tablets (10 mg total) by mouth 2 (two) times daily for 6 days, THEN 1 tablet (5 mg total) 2 (two) times daily. Start taking on: October 30, 2022   atorvastatin 80 MG tablet Commonly known as: LIPITOR Take  80 mg by mouth.   diclofenac Sodium 1 % Gel Commonly known as: VOLTAREN Apply 4 g topically 4 (four) times daily as needed (pain).   Glucosamine Sulfate 500 MG Tabs Take by mouth.   methylPREDNISolone 4 MG Tbpk tablet Commonly known as: MEDROL DOSEPAK Use as directed x 6 days.   metoprolol tartrate 25 MG tablet Commonly known as: LOPRESSOR Take 25 mg by mouth 2 (two) times daily.   Multi-Vitamins Tabs Take 1 tablet by mouth daily. What changed:  how much to take when to take this   Omega-3 1000 MG Caps Take by mouth.   oxyCODONE 5 MG immediate release tablet Commonly known as: Oxy IR/ROXICODONE Take 1 tablet (5 mg total) by mouth every 6 (six) hours as needed for up to 5 days for severe pain. What changed:  when to take this reasons to take this   RA Nicotine 7 mg/24hr patch Generic drug: nicotine Place onto the skin.   Trelegy Ellipta 100-62.5-25 MCG/ACT Aepb Generic drug: Fluticasone-Umeclidin-Vilant Inhale 1 puff into the lungs daily.               Durable Medical Equipment  (From admission, onward)           Start     Ordered   10/30/22 1324  DME Oxygen  Once       Question Answer Comment  Length of Need 6 Months   Mode or (Route) Nasal cannula   Liters per Minute 3   Frequency Continuous (stationary and portable oxygen unit needed)   Oxygen conserving device Yes   Oxygen delivery system Gas      10/30/22 1324   10/30/22 1052  For home use only DME oxygen  Once       Comments: 3L via nasal cannula with exertion and sleep  Question Answer Comment  Length of Need 6 Months   Mode or (Route) Nasal cannula   Liters per Minute 3   Frequency Continuous (stationary and portable oxygen unit needed)   Oxygen conserving device Yes   Oxygen delivery system Gas      10/30/22 1053              Discharge Care Instructions  (From admission, onward)           Start     Ordered   10/30/22 0000  No dressing needed        10/30/22 1324             Follow-up Information     [facility]. Call in 2 day(s).   Why: [address], [place], [place] [address] Phone: [phone]               Discharge Exam: Tommy [name] Weights   10/27/22 0838  Weight: 77 kg   General - elderly Caucasian male, no apparent distress. Lungs - no wheezes, rhochi or rales. Heart - S1, S2 hears, no murmurs. Extremities- no pedal edema. Right calf tenderness present. Neuro- AAOX3, non focal exam.  Condition at discharge: stable  The results of significant diagnostics from this hospitalization (including imaging, microbiology, ancillary and laboratory) are listed below for reference.  Imaging Studies: CT Angio Chest Pulmonary Embolism (PE) W or WO Contrast  Result Date: 10/28/2022 CLINICAL DATA:  Hypoxia, history of deep venous thrombosis. EXAM: CT ANGIOGRAPHY CHEST WITH CONTRAST TECHNIQUE: Multidetector CT imaging of the chest was performed using the standard protocol during bolus administration of intravenous contrast. Multiplanar CT image reconstructions and MIPs were obtained to evaluate the vascular anatomy. RADIATION DOSE REDUCTION: This exam was performed according to the departmental dose-optimization program which includes automated exposure control, adjustment of the mA and/or kV according to patient size and/or use of iterative reconstruction technique. CONTRAST:  75mL OMNIPAQUE IOHEXOL 350 MG/ML SOLN COMPARISON:  None Available. FINDINGS: Cardiovascular: Filling defect is noted in several peripheral lower lobe branches of right pulmonary artery consistent with pulmonary embolus. Mild cardiomegaly. Status post coronary bypass graft. No pericardial effusion. Mediastinum/Nodes: No enlarged mediastinal, hilar, or axillary lymph nodes. Thyroid gland, trachea, and esophagus demonstrate no significant findings. Lungs/Pleura: No pneumothorax is noted. Emphysematous disease is noted throughout both  lungs. Right upper lobe airspace opacity is noted concerning for pneumonia. Minimal right posterior basilar subsegmental atelectasis is noted. Large calcified granuloma is noted in left posterior costophrenic sulcus. 4 mm right upper lobe nodule is noted best seen on image number 69 of series 5. Upper Abdomen: Mild splenomegaly is noted. Musculoskeletal: No chest wall abnormality. No acute or significant osseous findings. Review of the MIP images confirms the above findings. IMPRESSION: Acute pulmonary embolus is noted in peripheral lower lobe branches of the right pulmonary artery. Critical Value/emergent results were called by telephone at the time of interpretation on 10/28/2022 at 2:50 pm to provider [facility] , who verbally acknowledged these results. Right upper lobe airspace opacity is noted concerning for possible pneumonia. 4 mm nodule noted in right upper lobe. Although likely benign, if the patient is high-risk, given the morphology and/or location of this nodule a non-contrast chest CT can be considered in 12 months.This recommendation follows the consensus statement: Guidelines for Management of Incidental Pulmonary Nodules Detected on CT Images: From the Fleischner Society 2017; Radiology 2017; 284:228-243. Status post coronary artery bypass graft. Mild splenomegaly. Aortic Atherosclerosis (ICD10-I70.0) and Emphysema (ICD10-J43.9). Electronically Signed   By: Lupita Raider M.D.   On: 10/28/2022 14:51   IR BONE MARROW BIOPSY & ASPIRATION  Result Date: 10/28/2022 INDICATION: Leukocytosis EXAM: Bone marrow aspiration and core biopsy using fluoroscopic guidance MEDICATIONS: None. ANESTHESIA/SEDATION: Moderate (conscious) sedation was employed during this procedure. A total of Versed 1 mg and Fentanyl 100 mcg was administered intravenously. Moderate Sedation Time: 10 minutes. The patient's level of consciousness and vital signs were monitored continuously by radiology nursing throughout the  procedure under my direct supervision. FLUOROSCOPY TIME:  Fluoroscopy Time: 0.6 minutes (5 mGy) COMPLICATIONS: None immediate. PROCEDURE: Informed written consent was obtained from the patient after a thorough discussion of the procedural risks, benefits and alternatives. All questions were addressed. Maximal Sterile Barrier Technique was utilized including caps, mask, sterile gowns, sterile gloves, sterile drape, hand hygiene and skin antiseptic. A timeout was performed prior to the initiation of the procedure. The patient was placed prone on the IR exam table. Limited fluoroscopy of the pelvis was performed for planning purposes. Skin entry site was marked, and the overlying skin was prepped and draped in the standard sterile fashion. Local analgesia was obtained with 1% lidocaine. Using fluoroscopic guidance, an 11 gauge needle was advanced just deep to the cortex of the right posterior ilium. Subsequently, bone marrow aspiration and core biopsy were performed. Specimens were submitted  to lab/pathology for handling. Hemostasis was achieved with manual pressure, and a clean dressing was placed. The patient tolerated the procedure well without immediate complication. IMPRESSION: Successful bone marrow aspiration and core biopsy of the right posterior ilium. Electronically Signed   By: Olive Bass M.D.   On: 10/28/2022 13:17   PERIPHERAL VASCULAR CATHETERIZATION  Result Date: 10/28/2022 See surgical note for result.  US Venous Img Lower Right (DVT Study)  Result Date: 10/27/2022 CLINICAL DATA:  Calf pain EXAM: Right LOWER EXTREMITY VENOUS DOPPLER ULTRASOUND TECHNIQUE: Gray-scale sonography with compression, as well as color and duplex ultrasound, were performed to evaluate the deep venous system(s) from the level of the common femoral vein through the popliteal and proximal calf veins. COMPARISON:  None Available. FINDINGS: VENOUS Extensive acute occlusive DVT is noted in right lower extremity involving  common femoral, femoral, popliteal, posterior tibial and peroneal veins. Limited views of the contralateral common femoral vein are unremarkable. OTHER None. Limitations: none IMPRESSION: Extensive occlusive acute DVT is noted in major deep veins in right lower extremity. Electronically Signed   By: Ernie Avena M.D.   On: 10/27/2022 12:56   MR LUMBAR SPINE WO CONTRAST  Result Date: 10/27/2022 CLINICAL DATA:  Low back and right leg pain.  Remote back surgery. EXAM: MRI LUMBAR SPINE WITHOUT CONTRAST TECHNIQUE: Multiplanar, multisequence MR imaging of the lumbar spine was performed. No intravenous contrast was administered. COMPARISON:  Lumbar spine radiographs 09/12/2016. Lumbar MRI from [facility] 10/03/2016. Abdominopelvic CT 12/13/2011. FINDINGS: Segmentation: Transitional lumbosacral anatomy. In keeping with the numbering applied to the previous study, there is a transitional, nearly fully sacralized L5 segment. Alignment: Mild convex left scoliosis. Minimal retrolisthesis at L4-5. Vertebrae: No worrisome osseous lesion, acute fracture or pars defect. Chronic endplate degenerative changes at L3-4 and L4-5. Conus medullaris: Extends to the L1 level and appears normal. Paraspinal and other soft tissues: No significant paraspinal findings. 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 A chronic peripherally calcified collection anterolateral to the right psoas muscle appears unchanged. There is a heterogeneous sebaceous cyst along the posterior left lower back which appears unchanged. Disc levels: Sagittal images demonstrate no significant disc space findings within the visualized lower thoracic spine. L1-2: Preserved disc height. There is mild disc bulging with a shallow left foraminal disc protrusion. Mild left foraminal narrowing without definite L1 nerve root encroachment. L2-3: Mild loss of disc height with mild disc bulging, facet and  ligamentous hypertrophy. No significant spinal stenosis or nerve root encroachment. L3-4: Chronic loss of disc height with annular disc bulging and endplate osteophytes asymmetric to the right. Mild facet and ligamentous hypertrophy. The spinal canal and lateral recesses are patent. Mild right foraminal narrowing appears stable. L4-5: Remote right hemilaminectomy. Chronic loss of disc height with annular disc bulging and endplate osteophytes. Mild bilateral facet hypertrophy. Stable mild scarring in the right lateral recess. The spinal canal and left lateral recess are widely patent. Unchanged mild foraminal narrowing bilaterally. L5-S1: As numbered, this disc space is transitional without acquired abnormality. IMPRESSION: 1. Transitional lumbosacral anatomy. In keeping with the numbering applied to the previous MRI, the transitional segment is nearly fully sacralized at L5. 2. No acute findings or clear explanation for the patient's symptoms. 3. Stable postsurgical changes on the right at L4-5 with mild scarring in the right lateral recess and mild foraminal narrowing bilaterally. 4. Stable mild right foraminal narrowing at L3-4 due to asymmetric disc bulging and endplate osteophytes. 5. Stable chronic peripherally  calcified collection anterolateral to the right psoas muscle. Electronically Signed   By: Carey Bullocks M.D.   On: 10/27/2022 11:52   CT TIBIA FIBULA RIGHT W CONTRAST  Result Date: 10/27/2022 CLINICAL DATA:  Soft tissue infection suspected, lower leg. Right posterior leg pain and swelling for 1 month. EXAM: CT OF THE LOWER RIGHT EXTREMITY WITH CONTRAST TECHNIQUE: Multidetector CT imaging of the right lower leg was performed according to the standard protocol following intravenous contrast administration. RADIATION DOSE REDUCTION: This exam was performed according to the departmental dose-optimization program which includes automated exposure control, adjustment of the mA and/or kV according to  patient size and/or use of iterative reconstruction technique. CONTRAST:  OMNIPAQUE IOHEXOL 300 MG/ML  SOLN COMPARISON:  None Available. FINDINGS: Bones/Joint/Cartilage No evidence of acute fracture or dislocation. The right tibia and fibula appear normal. No significant arthropathy or effusion identified at the right knee or ankle. Ligaments Suboptimally assessed by CT. Muscles and Tendons No muscular abnormalities are identified in the lower leg. Specifically, no evidence of intramuscular fluid collection, atrophy or suspicious enhancement. The patellar tendon appears normal. The quadriceps tendon is incompletely visualized, although appears intact. The ankle tendons appear intact. Soft tissues This study was performed with intravenous contrast during the arterial phase of contrast administration. There is popliteal and runoff atherosclerosis without evidence of large vessel arterial occlusion. No significant venous opacification is demonstrated. The popliteal vein appears distended with central low-density, findings which are suspicious for deep venous thrombosis. No other significant soft tissue abnormalities are identified. IMPRESSION: 1. No acute osseous findings or significant arthropathy identified in the right lower leg. 2. No evidence of intramuscular fluid collection, atrophy or suspicious enhancement. 3. The popliteal vein appears distended with central low-density, suspicious for deep venous thrombosis. This is suboptimally evaluated by this examination performed during the arterial phase of contrast administration. Patient is already scheduled for venous doppler ultrasound today. 4. Atherosclerosis without evidence of large vessel arterial occlusion. Electronically Signed   By: Carey Bullocks M.D.   On: 10/27/2022 11:39    Microbiology: No results found for this or any previous visit.  Labs: CBC: Recent Labs  Lab 10/27/22 0857 10/28/22 0456 10/29/22 0508 10/30/22 0603  WBC 52.5*  46.0* 42.7* 42.1*  NEUTROABS 33.6* 27.0*  --  25.5*  HGB 15.0 13.3 12.4* 12.3*  HCT 53.3* 46.1 43.6 42.2  MCV 84.1 82.6 83.5 82.7  PLT 94* 84* 85* 93*   Basic Metabolic Panel: Recent Labs  Lab 10/27/22 0857 10/28/22 0456 10/29/22 1054 10/30/22 0603  NA 138 137 134* 136  K 4.2 3.8 3.7 3.8  CL 105 109 106 107  CO2 21* 20* 20* 19*  GLUCOSE 120* 95 147* 101*  BUN 22 17 13 14   CREATININE 1.37* 1.17 1.21 1.22  CALCIUM 8.7* 7.8* 8.0* 8.1*  MG  --   --  2.0  --   PHOS  --   --  3.8  --    Liver Function Tests: Recent Labs  Lab 10/30/22 0603  AST 20  ALT 11  ALKPHOS 97  BILITOT 1.4*  PROT 6.5  ALBUMIN 2.9*   CBG: No results for input(s): "GLUCAP" in the last 168 hours.  Discharge time spent: greater than 30 minutes.  Signed: Marcelino Duster, MD [facility] 10/30/2022

## 2022-10-30 NOTE — Progress Notes (Signed)
Mobility Specialist - Progress Note   10/30/22 1029  Mobility  Activity Ambulated with assistance in room;Stood at bedside;Dangled on edge of bed  Level of Assistance Standby assist, set-up cues, supervision of patient - no hands on  Assistive Device Front wheel walker  Distance Ambulated (ft) 40 ft  Activity Response Tolerated well  Mobility Referral Yes  $Mobility charge 1 Mobility  Mobility Specialist Start Time (ACUTE ONLY) 1011  Mobility Specialist Stop Time (ACUTE ONLY) 1028  Mobility Specialist Time Calculation (min) (ACUTE ONLY) 17 min   Nurse requested Mobility Specialist to perform oxygen saturation test with pt which includes removing pt from oxygen both at rest and while ambulating.  Below are the results from that testing.    On 3L  Pre-mobility: SpO2 = 95  On RA During mobility: SpO2 = 84 (end of ambulation)  On 3L  Post-mobility: SPO2 = 91 (After 1 minute of resting with 3L)   At end of testing pt left in room on 3L.    Reported results to nurse.  Terrilyn Saver  Mobility Specialist  10/30/22 10:33 AM

## 2022-10-30 NOTE — TOC Transition Note (Signed)
Transition of Care [facility]) - CM/SW Discharge Note   Patient Details  Name: Tommy [name] MRN: [id number] Date of Birth: 1951/01/04  [facility]) CM/SW Contact:  Bing Quarry, RN Phone Number: 10/30/2022, 1:38 PM   Clinical Narrative: 6/2: Discharge orders in pending delivery of portable oxygen ordered via [employer]. Gabriel Cirri RN CM       Final next level of care: Home/Self Care Barriers to Discharge: Barriers Resolved   Patient Goals and CMS Choice      Discharge Placement                         Discharge Plan and Services Additional resources added to the After Visit Summary for                  DME Arranged: Oxygen DME Agency: [employer] Date DME Agency Contacted: 10/30/22 Time DME Agency Contacted: 1100 Representative spoke with at DME Agency: Jasmine HH Arranged: NA HH Agency: NA        Social Determinants of Health (SDOH) Interventions SDOH Screenings   Food Insecurity: No Food Insecurity (10/27/2022)  Housing: Low Risk  (10/27/2022)  Transportation Needs: No Transportation Needs (10/27/2022)  Utilities: Not At Risk (10/27/2022)  Tobacco Use: Medium Risk (10/28/2022)     Readmission Risk Interventions    10/30/2022   10:58 AM  Readmission Risk Prevention Plan  Post Dischage Appt Complete  Medication Screening Complete  Transportation Screening Complete

## 2022-10-30 NOTE — Evaluation (Signed)
Occupational Therapy Evaluation Patient Details Name: Tommy [name] MRN: [id number] DOB: 11-01-1950 Today's Date: 10/30/2022   History of Present Illness presented to ER secondary to progressive R LE pain, edema; admitted for management of extensive, acute R LE DVT (s/p thrombectomy and IVC filter placement 10/28/22) and acute PE (managed with heparin drip, started 10/27/22).  Per attending, planning for upcoming transition to eliquis; cleared for ambulation at current heparin levels.  Medical work up also revealing for leukocytosis, s/p bone marrow biopsy (10/28/22); results pending.   Clinical Impression   Mr. Hrivnak was seen for OT evaluation this date. Pt presents at or near functional baseline, with some noted safety awareness deficits surrounding O2 use for functional activity. Pt adamantly declines needs for further ADL training or energy conservation education. Pt states "I know my body best and I wont be using oxygen at home." This is after this author educated pt on O2 sats during functional activity with significant desat to 79% appreciated while on RA. See ADL section below for additional detail. MD/RN notified via secure chat regarding pt O2 needs during session and potential need for home O2 use. Pt left on 3L [place] spo2 at end of session 93%. No further skilled OT needs identified. Will sign off at this time.       Recommendations for follow up therapy are one component of a multi-disciplinary discharge planning process, led by the attending physician.  Recommendations may be updated based on patient status, additional functional criteria and insurance authorization.   Assistance Recommended at Discharge    Patient can return home with the following      Functional Status Assessment  Patient has not had a recent decline in their functional status  Equipment Recommendations  None recommended by OT    Recommendations for Other Services       Precautions / Restrictions  Precautions Precautions: Fall Restrictions Weight Bearing Restrictions: No      Mobility Bed Mobility Overal bed mobility: Modified Independent                  Transfers Overall transfer level: Needs assistance Equipment used: Rolling walker (2 wheels) Transfers: Sit to/from Stand Sit to Stand: Supervision                  Balance Overall balance assessment: No apparent balance deficits (not formally assessed)                                         ADL either performed or assessed with clinical judgement   ADL Overall ADL's : At baseline                                       General ADL Comments: Pt is at or near funcitonal baseline. Minimally limited by decreased spO2 with functional mobility, however, pt aware and declines education on energy conservation and safety with home O2. States "I won't be using it at home". O2 sats noted to drop to 79-80% with brief functional transfer in/out of the room bathroom with pt on RA. Pt notably SOB.  Requires therapeutic rest break to rebound to 93% by end of session. RN/MD notified and aware. Pt reports having pulse ox at home and verbalizes understanding on how to use it. s able to independently don  hospital socks at EOB.     Vision Patient Visual Report: No change from baseline       Perception     Praxis      Pertinent Vitals/Pain Pain Assessment Pain Assessment: 0-10 Pain Score: 7  Pain Location: R LE Pain Descriptors / Indicators: Aching, Sore, Guarding, Grimacing Pain Intervention(s): Limited activity within patient's tolerance, Monitored during session, Premedicated before session, Repositioned     Hand Dominance Right   Extremity/Trunk Assessment Upper Extremity Assessment Upper Extremity Assessment: Overall WFL for tasks assessed   Lower Extremity Assessment Lower Extremity Assessment: Overall WFL for tasks assessed;RLE deficits/detail;Defer to PT evaluation RLE  Deficits / Details: Pain limited RLE, pt tends to maintain NWB with mobility   Cervical / Trunk Assessment Cervical / Trunk Assessment: Normal   Communication Communication Communication: No difficulties   Cognition Arousal/Alertness: Awake/alert Behavior During Therapy: WFL for tasks assessed/performed Overall Cognitive Status: Within Functional Limits for tasks assessed                                 General Comments: Noted decreased safety awareness surrounding O2 use.     General Comments       Exercises Other Exercises Other Exercises: Pt/spouse educated on safety, falls prevention, and energy conservation as described above. Pt generally non-accepting of education endorses, "I already know my body" declines further need for ADL training.   Shoulder Instructions      Home Living Family/patient expects to be discharged to:: Private residence Living Arrangements: Spouse/significant other Available Help at Discharge: Family;Available 24 hours/day Type of Home: House Home Access: Stairs to enter [employer] of Steps: 3 Entrance Stairs-Rails: Right Home Layout: Two level;Able to live on main level with bedroom/bathroom Alternate Level Stairs-Number of Steps: essential rooms on main level of home; likes to take baths on second level   Bathroom Shower/Tub: Chief Strategy Officer: Standard     Home Equipment: Agricultural consultant (2 wheels)   Additional Comments: Not on home O2      Prior Functioning/Environment Prior Level of Function : Independent/Modified Independent;Driving             Mobility Comments: MOD I with RW, Endorses falls in last 6 months. ADLs Comments: Indep for ADL management. Active, eager to get home.        OT Problem List: Decreased activity tolerance;Decreased safety awareness;Cardiopulmonary status limiting activity;Decreased knowledge of use of DME or AE      OT Treatment/Interventions:      OT  Goals(Current goals can be found in the care plan section) Acute Rehab OT Goals Patient Stated Goal: To leave the hospital today OT Goal Formulation: All assessment and education complete, DC therapy Time For Goal Achievement: 10/30/22 Potential to Achieve Goals: Fair  OT Frequency:      Co-evaluation              AM-PAC OT "6 Clicks" Daily Activity     Outcome Measure Help from another person eating meals?: None Help from another person taking care of personal grooming?: None Help from another person toileting, which includes using toliet, bedpan, or urinal?: A Little Help from another person bathing (including washing, rinsing, drying)?: A Little Help from another person to put on and taking off regular upper body clothing?: None Help from another person to put on and taking off regular lower body clothing?: A Little 6 Click Score: 21   End  of Session Equipment Utilized During Treatment: Gait belt;Rolling walker (2 wheels) Nurse Communication: Other (comment) (O2 needs during session.)  Activity Tolerance: Patient tolerated treatment well Patient left: in bed (seated EOB, recieved with no bed alarm on. Pt declines bed alarm.)  OT Visit Diagnosis: Other abnormalities of gait and mobility (R26.89)                Time: 0812-0829 OT Time Calculation (min): 17 min Charges:  OT General Charges $OT Visit: 1 Visit OT Evaluation $OT Eval Moderate Complexity: 1 Mod  Rockney Ghee, M.S., OTR/L 10/30/22, 9:54 AM

## 2022-10-30 NOTE — Progress Notes (Signed)
Patient discharged to home. All belongings sent home with patient. Patient IV removed and all questions and education on discharge provided. Oxygen sent home with patient.

## 2022-10-31 ENCOUNTER — Telehealth: Payer: Self-pay

## 2022-10-31 ENCOUNTER — Encounter: Payer: Self-pay | Admitting: Vascular Surgery

## 2022-10-31 LAB — ECHOCARDIOGRAM COMPLETE
AR max vel: 2.6 cm2
AV Area VTI: 2.69 cm2
AV Area mean vel: 2.49 cm2
AV Mean grad: 2.5 mmHg
AV Peak grad: 4.5 mmHg
Ao pk vel: 1.06 m/s
Area-P 1/2: 2.7 cm2
Height: 70 in
S' Lateral: 2.2 cm

## 2022-10-31 NOTE — Telephone Encounter (Signed)
This patients wife called requesting an appointment. I let her know that since he would be new, someone that schedules referrals will give her a call. She was concerned about the biopsy results from the bone marrow biopsy preformed last week. The physician told her that the only way they would know the results is by having an appointment at the cancer center. I told her someone would reach out to her and her husband once we received a referral.

## 2022-11-01 ENCOUNTER — Ambulatory Visit: Admission: RE | Admit: 2022-11-01 | Payer: Medicare Other | Source: Ambulatory Visit

## 2022-11-01 ENCOUNTER — Ambulatory Visit: Payer: 59 | Admitting: Neurosurgery

## 2022-11-01 NOTE — Telephone Encounter (Signed)
His MRI was cancelled. It looks like he is going through other medical issues.

## 2022-11-01 NOTE — Telephone Encounter (Signed)
This is an old message. He had lumbar MRI done and we discussed it.

## 2022-11-02 ENCOUNTER — Other Ambulatory Visit: Payer: Self-pay | Admitting: *Deleted

## 2022-11-02 ENCOUNTER — Other Ambulatory Visit: Payer: Medicare Other

## 2022-11-02 DIAGNOSIS — D72829 Elevated white blood cell count, unspecified: Secondary | ICD-10-CM

## 2022-11-02 LAB — SURGICAL PATHOLOGY

## 2022-11-03 ENCOUNTER — Telehealth: Payer: Self-pay

## 2022-11-03 ENCOUNTER — Encounter: Payer: Self-pay | Admitting: Oncology

## 2022-11-03 ENCOUNTER — Inpatient Hospital Stay: Payer: Medicare Other | Attending: Oncology

## 2022-11-03 ENCOUNTER — Other Ambulatory Visit: Payer: Self-pay

## 2022-11-03 ENCOUNTER — Inpatient Hospital Stay (HOSPITAL_BASED_OUTPATIENT_CLINIC_OR_DEPARTMENT_OTHER): Payer: Medicare Other | Admitting: Oncology

## 2022-11-03 VITALS — BP 133/85 | HR 96 | Temp 98.0°F | Resp 16 | Ht 71.0 in | Wt 164.7 lb

## 2022-11-03 DIAGNOSIS — I82491 Acute embolism and thrombosis of other specified deep vein of right lower extremity: Secondary | ICD-10-CM

## 2022-11-03 DIAGNOSIS — M79661 Pain in right lower leg: Secondary | ICD-10-CM | POA: Diagnosis not present

## 2022-11-03 DIAGNOSIS — Z86718 Personal history of other venous thrombosis and embolism: Secondary | ICD-10-CM | POA: Insufficient documentation

## 2022-11-03 DIAGNOSIS — D696 Thrombocytopenia, unspecified: Secondary | ICD-10-CM | POA: Diagnosis not present

## 2022-11-03 DIAGNOSIS — R634 Abnormal weight loss: Secondary | ICD-10-CM | POA: Diagnosis not present

## 2022-11-03 DIAGNOSIS — I82411 Acute embolism and thrombosis of right femoral vein: Secondary | ICD-10-CM | POA: Diagnosis present

## 2022-11-03 DIAGNOSIS — M48061 Spinal stenosis, lumbar region without neurogenic claudication: Secondary | ICD-10-CM | POA: Diagnosis not present

## 2022-11-03 DIAGNOSIS — Z79899 Other long term (current) drug therapy: Secondary | ICD-10-CM | POA: Insufficient documentation

## 2022-11-03 DIAGNOSIS — I700 Atherosclerosis of aorta: Secondary | ICD-10-CM | POA: Insufficient documentation

## 2022-11-03 DIAGNOSIS — Z87891 Personal history of nicotine dependence: Secondary | ICD-10-CM | POA: Insufficient documentation

## 2022-11-03 DIAGNOSIS — J439 Emphysema, unspecified: Secondary | ICD-10-CM | POA: Insufficient documentation

## 2022-11-03 DIAGNOSIS — D72829 Elevated white blood cell count, unspecified: Secondary | ICD-10-CM

## 2022-11-03 DIAGNOSIS — R531 Weakness: Secondary | ICD-10-CM | POA: Insufficient documentation

## 2022-11-03 DIAGNOSIS — Z7901 Long term (current) use of anticoagulants: Secondary | ICD-10-CM | POA: Insufficient documentation

## 2022-11-03 DIAGNOSIS — R0902 Hypoxemia: Secondary | ICD-10-CM | POA: Insufficient documentation

## 2022-11-03 DIAGNOSIS — M47816 Spondylosis without myelopathy or radiculopathy, lumbar region: Secondary | ICD-10-CM | POA: Insufficient documentation

## 2022-11-03 DIAGNOSIS — I82441 Acute embolism and thrombosis of right tibial vein: Secondary | ICD-10-CM | POA: Diagnosis present

## 2022-11-03 DIAGNOSIS — M4316 Spondylolisthesis, lumbar region: Secondary | ICD-10-CM | POA: Insufficient documentation

## 2022-11-03 DIAGNOSIS — M5136 Other intervertebral disc degeneration, lumbar region: Secondary | ICD-10-CM | POA: Insufficient documentation

## 2022-11-03 DIAGNOSIS — I517 Cardiomegaly: Secondary | ICD-10-CM | POA: Diagnosis not present

## 2022-11-03 DIAGNOSIS — I2510 Atherosclerotic heart disease of native coronary artery without angina pectoris: Secondary | ICD-10-CM | POA: Diagnosis not present

## 2022-11-03 DIAGNOSIS — L723 Sebaceous cyst: Secondary | ICD-10-CM | POA: Insufficient documentation

## 2022-11-03 LAB — CBC WITH DIFFERENTIAL/PLATELET
Abs Immature Granulocytes: 13.5 10*3/uL — ABNORMAL HIGH (ref 0.00–0.07)
Band Neutrophils: 1 %
Basophils Absolute: 0 10*3/uL (ref 0.0–0.1)
Basophils Relative: 0 %
Blasts: 10 %
Eosinophils Absolute: 0 10*3/uL (ref 0.0–0.5)
Eosinophils Relative: 0 %
HCT: 47.2 % (ref 39.0–52.0)
Hemoglobin: 13.5 g/dL (ref 13.0–17.0)
Lymphocytes Relative: 5 %
Lymphs Abs: 2.7 10*3/uL (ref 0.7–4.0)
MCH: 23.9 pg — ABNORMAL LOW (ref 26.0–34.0)
MCHC: 28.6 g/dL — ABNORMAL LOW (ref 30.0–36.0)
MCV: 83.4 fL (ref 80.0–100.0)
Metamyelocytes Relative: 10 %
Monocytes Absolute: 4.3 10*3/uL — ABNORMAL HIGH (ref 0.1–1.0)
Monocytes Relative: 8 %
Myelocytes: 14 %
Neutro Abs: 28.1 10*3/uL — ABNORMAL HIGH (ref 1.7–7.7)
Neutrophils Relative %: 51 %
Platelets: 121 10*3/uL — ABNORMAL LOW (ref 150–400)
Promyelocytes Relative: 1 %
RBC: 5.66 MIL/uL (ref 4.22–5.81)
RDW: 20.5 % — ABNORMAL HIGH (ref 11.5–15.5)
Smear Review: DECREASED
WBC: 54 10*3/uL (ref 4.0–10.5)
nRBC: 3.4 % — ABNORMAL HIGH (ref 0.0–0.2)

## 2022-11-03 MED ORDER — OXYCODONE HCL 5 MG PO TABS: 5.0000 mg | ORAL_TABLET | Freq: Four times a day (QID) | ORAL | 0 refills | Status: DC | PRN

## 2022-11-03 NOTE — Addendum Note (Signed)
Addended by: Luz Lex on: 11/03/2022 01:29 PM   Modules accepted: Orders

## 2022-11-03 NOTE — Progress Notes (Signed)
Still having swelling and a significant amount of pain in R leg from DVT. He would like a refill of the oxycodone 5 mg.

## 2022-11-03 NOTE — Telephone Encounter (Signed)
Referral faxed over to Dr. Cindra Presume at [facility].   Fax# [phone]

## 2022-11-03 NOTE — Progress Notes (Signed)
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  INTERVAL HISTORY: Patient returns to clinic today for hospital follow-up and discussion of his bone marrow biopsy results.  He continues to have right leg swelling and pain from his DVT, but otherwise feels well.  He has no neurologic complaints.  He denies any recent fevers or illnesses.  He has a good appetite, but does admit to approximately 30 pound weight loss over the last 6 months.  He has no chest pain, shortness of breath, cough, or hemoptysis.  He denies any nausea, vomiting, constipation, or diarrhea.  He has no urinary complaints.  Patient offers no further specific complaints today.  REVIEW OF SYSTEMS:   Review of Systems  Constitutional:  Positive for weight loss. Negative for fever and malaise/fatigue.  Respiratory: Negative.  Negative for cough, hemoptysis and shortness of breath.   Cardiovascular: Negative.  Negative for chest pain and leg swelling.  Gastrointestinal: Negative.  Negative for abdominal pain.  Genitourinary: Negative.  Negative for dysuria.  Musculoskeletal: Negative.  Negative for back pain.  Skin: Negative.  Negative for rash.  Neurological:  Positive for weakness. Negative for dizziness, focal weakness and headaches.  Psychiatric/Behavioral: Negative.  The patient is not nervous/anxious.     As per HPI. Otherwise, a complete review of systems is negative.  PAST MEDICAL HISTORY: Past Medical History:  Diagnosis Date   Coronary artery disease    Renal disorder     PAST SURGICAL HISTORY: Past Surgical History:  Procedure Laterality Date   BACK SURGERY     CARDIAC SURGERY     IR BONE MARROW BIOPSY & ASPIRATION  10/28/2022   IVC FILTER  INSERTION N/A 10/28/2022   Procedure: IVC FILTER INSERTION;  Surgeon: Annice Needy, MD;  Location: [facility] INVASIVE CV LAB;  Service: Cardiovascular;  Laterality: N/A;   KIDNEY SURGERY     PERIPHERAL VASCULAR THROMBECTOMY Right 10/28/2022   Procedure: PERIPHERAL VASCULAR THROMBECTOMY;  Surgeon: Annice Needy, MD;  Location: [facility] INVASIVE CV LAB;  Service: Cardiovascular;  Laterality: Right;    FAMILY HISTORY: Family History  Problem Relation Age of Onset   Non-Hodgkin's lymphoma Brother     ADVANCED DIRECTIVES (Y/N):  N  HEALTH MAINTENANCE: Social History   Tobacco Use   Smoking status: Former    Packs/day: 0.65    Years: 51.00    Additional pack years: 0.00    Total pack years: 33.15    Types: Cigarettes    Quit date: 2023    Years since quitting: 1.4   Smokeless tobacco: Never  Substance Use Topics   Alcohol use: Not Currently   Drug use: Never     Colonoscopy:  PAP:  Bone density:  Lipid panel:  No Known Allergies  Current Outpatient Medications  Medication Sig Dispense Refill   albuterol (VENTOLIN HFA) 108 (90 Base) MCG/ACT inhaler Inhale 2 puffs into the lungs every 4 (four) hours as needed for shortness of breath or wheezing. 8 g 0   amoxicillin-clavulanate (AUGMENTIN) 875-125 MG tablet Take 1 tablet by mouth 2 (two) times daily for 5 days. 10 tablet 0   apixaban (ELIQUIS) 5 MG TABS tablet Take 2 tablets (10 mg total) by mouth 2 (two) times daily for 6 days, THEN 1 tablet (5 mg  total) 2 (two) times daily. 84 tablet 0   atorvastatin (LIPITOR) 80 MG tablet Take 80 mg by mouth.     Glucosamine Sulfate 500 MG TABS Take by mouth.     metoprolol tartrate (LOPRESSOR) 25 MG tablet Take 25 mg by mouth 2 (two) times daily.     Multiple Vitamin (MULTI-VITAMINS) TABS Take 1 tablet by mouth daily. 30 tablet 3   TRELEGY ELLIPTA 100-62.5-25 MCG/ACT AEPB Inhale 1 puff into the lungs daily. 30 each 1   diclofenac Sodium (VOLTAREN) 1 % GEL Apply 4 g topically 4 (four) times daily as  needed (pain). (Patient not taking: Reported on 10/27/2022)     oxyCODONE (OXY IR/ROXICODONE) 5 MG immediate release tablet Take 1 tablet (5 mg total) by mouth every 6 (six) hours as needed for severe pain. 60 tablet 0   No current facility-administered medications for this visit.    OBJECTIVE: Vitals:   11/03/22 1022  BP: 133/85  Pulse: 96  Resp: 16  Temp: 98 F (36.7 C)  SpO2: 95%     Body mass index is 22.97 kg/m.    ECOG FS:1 - Symptomatic but completely ambulatory  General: Well-developed, well-nourished, no acute distress. Eyes: Pink conjunctiva, anicteric sclera. HEENT: Normocephalic, moist mucous membranes. Lungs: No audible wheezing or coughing. Heart: Regular rate and rhythm. Abdomen: Soft, nontender, no obvious distention. Musculoskeletal: Increased edema of right leg. Neuro: Alert, answering all questions appropriately. Cranial nerves grossly intact. Skin: No rashes or petechiae noted. Psych: Normal affect. Lymphatics: No cervical, calvicular, axillary or inguinal LAD.   LAB RESULTS:  Lab Results  Component Value Date   NA 136 10/30/2022   K 3.8 10/30/2022   CL 107 10/30/2022   CO2 19 (L) 10/30/2022   GLUCOSE 101 (H) 10/30/2022   BUN 14 10/30/2022   CREATININE 1.22 10/30/2022   CALCIUM 8.1 (L) 10/30/2022   PROT 6.5 10/30/2022   ALBUMIN 2.9 (L) 10/30/2022   AST 20 10/30/2022   ALT 11 10/30/2022   ALKPHOS 97 10/30/2022   BILITOT 1.4 (H) 10/30/2022   GFRNONAA >60 10/30/2022   GFRAA >60 08/27/2011    Lab Results  Component Value Date   WBC 54.0 (HH) 11/03/2022   NEUTROABS 28.1 (H) 11/03/2022   HGB 13.5 11/03/2022   HCT 47.2 11/03/2022   MCV 83.4 11/03/2022   PLT 121 (L) 11/03/2022     STUDIES: ECHOCARDIOGRAM COMPLETE  Result Date: 10/31/2022    ECHOCARDIOGRAM REPORT   Patient Name:   Tommy [name] Date of Exam: 10/30/2022 Medical Rec #:  [id number]              Height:       70.0 in Accession #:    [id number]             Weight:        169.8 lb Date of Birth:  20-Jan-1951              BSA:          1.947 m Patient Age:    71 years               BP:           109/68 mmHg Patient Gender: M                      HR:           69 bpm. Exam Location:  [facility] Procedure: 2D Echo, Cardiac Doppler and Color  Doppler Indications:    I26.02 Pulmonary embolus  History:        Patient has no prior history of Echocardiogram examinations.                 Prior CABG, Pulmonary Embolus; Risk Factors:Hypertension. H/O                 DVT. IVC filter placement.  Sonographer:    Dondra Prader RVT RCS Referring Phys: [id number] [facility]  Sonographer Comments: Suboptimal apical window. IMPRESSIONS  1. Left ventricular ejection fraction, by estimation, is 60 to 65%. The left ventricle has normal function. The left ventricle has no regional wall motion abnormalities. There is mild concentric left ventricular hypertrophy. Left ventricular diastolic parameters are consistent with Grade II diastolic dysfunction (pseudonormalization).  2. Right ventricular systolic function is normal. The right ventricular size is normal. There is moderately elevated pulmonary artery systolic pressure.  3. Right atrial size was moderately dilated.  4. The mitral valve is normal in structure. Trivial mitral valve regurgitation. No evidence of mitral stenosis.  5. The aortic valve is tricuspid. Aortic valve regurgitation is not visualized. No aortic stenosis is present.  6. The inferior vena cava is dilated in size with >50% respiratory variability, suggesting right atrial pressure of 8 mmHg. FINDINGS  Left Ventricle: Left ventricular ejection fraction, by estimation, is 60 to 65%. The left ventricle has normal function. The left ventricle has no regional wall motion abnormalities. The left ventricular internal cavity size was normal in size. There is  mild concentric left ventricular hypertrophy. Left ventricular diastolic parameters are consistent with Grade II diastolic dysfunction  (pseudonormalization). Right Ventricle: The right ventricular size is normal. No increase in right ventricular wall thickness. Right ventricular systolic function is normal. There is moderately elevated pulmonary artery systolic pressure. The tricuspid regurgitant velocity is 3.50 m/s, and with an assumed right atrial pressure of 8 mmHg, the estimated right ventricular systolic pressure is 57.0 mmHg. Left Atrium: Left atrial size was normal in size. Right Atrium: Right atrial size was moderately dilated. Pericardium: There is no evidence of pericardial effusion. Mitral Valve: The mitral valve is normal in structure. Trivial mitral valve regurgitation. No evidence of mitral valve stenosis. Tricuspid Valve: The tricuspid valve is normal in structure. Tricuspid valve regurgitation is trivial. No evidence of tricuspid stenosis. Aortic Valve: The aortic valve is tricuspid. Aortic valve regurgitation is not visualized. No aortic stenosis is present. Aortic valve mean gradient measures 2.5 mmHg. Aortic valve peak gradient measures 4.5 mmHg. Aortic valve area, by VTI measures 2.69 cm. Pulmonic Valve: The pulmonic valve was normal in structure. Pulmonic valve regurgitation is not visualized. No evidence of pulmonic stenosis. Aorta: The aortic root is normal in size and structure. Venous: The inferior vena cava is dilated in size with greater than 50% respiratory variability, suggesting right atrial pressure of 8 mmHg. IAS/Shunts: No atrial level shunt detected by color flow Doppler.  LEFT VENTRICLE PLAX 2D LVIDd:         4.50 cm   Diastology LVIDs:         2.20 cm   LV e' medial:    10.70 cm/s LV PW:         1.30 cm   LV E/e' medial:  8.6 LV IVS:        1.10 cm   LV e' lateral:   12.60 cm/s LVOT diam:     1.90 cm   LV E/e' lateral: 7.3 LV SV:  57 LV SV Index:   29 LVOT Area:     2.84 cm  RIGHT VENTRICLE             IVC RV Basal diam:  3.90 cm     IVC diam: 2.60 cm RV Mid diam:    3.40 cm RV S prime:     12.50 cm/s  LEFT ATRIUM           Index        RIGHT ATRIUM           Index LA diam:      3.50 cm 1.80 cm/m   RA Area:     17.95 cm LA Vol (A4C): 45.9 ml 23.58 ml/m  RA Volume:   52.50 ml  26.97 ml/m  AORTIC VALVE                    PULMONIC VALVE AV Area (Vmax):    2.60 cm     PV Vmax:       0.79 m/s AV Area (Vmean):   2.49 cm     PV Peak grad:  2.5 mmHg AV Area (VTI):     2.69 cm AV Vmax:           105.65 cm/s AV Vmean:          71.600 cm/s AV VTI:            0.212 m AV Peak Grad:      4.5 mmHg AV Mean Grad:      2.5 mmHg LVOT Vmax:         96.90 cm/s LVOT Vmean:        62.900 cm/s LVOT VTI:          0.201 m LVOT/AV VTI ratio: 0.95  AORTA Ao Root diam: 3.70 cm Ao Asc diam:  3.40 cm MITRAL VALVE               TRICUSPID VALVE MV Area (PHT): 2.70 cm    TR Peak grad:   49.0 mmHg MV Decel Time: 281 msec    TR Vmax:        350.00 cm/s MV E velocity: 91.70 cm/s MV A velocity: 80.70 cm/s  SHUNTS MV E/A ratio:  1.14        Systemic VTI:  0.20 m                            Systemic Diam: 1.90 cm Chilton Si MD Electronically signed by Chilton Si MD Signature Date/Time: 10/31/2022/7:51:49 AM    Final    CT Angio Chest Pulmonary Embolism (PE) W or WO Contrast  Result Date: 10/28/2022 CLINICAL DATA:  Hypoxia, history of deep venous thrombosis. EXAM: CT ANGIOGRAPHY CHEST WITH CONTRAST TECHNIQUE: Multidetector CT imaging of the chest was performed using the standard protocol during bolus administration of intravenous contrast. Multiplanar CT image reconstructions and MIPs were obtained to evaluate the vascular anatomy. RADIATION DOSE REDUCTION: This exam was performed according to the departmental dose-optimization program which includes automated exposure control, adjustment of the mA and/or kV according to patient size and/or use of iterative reconstruction technique. CONTRAST:  75mL OMNIPAQUE IOHEXOL 350 MG/ML SOLN COMPARISON:  None Available. FINDINGS: Cardiovascular: Filling defect is noted in several peripheral  lower lobe branches of right pulmonary artery consistent with pulmonary embolus. Mild cardiomegaly. Status post coronary bypass graft. No pericardial effusion. Mediastinum/Nodes: No enlarged mediastinal, hilar, or axillary lymph nodes.  Thyroid gland, trachea, and esophagus demonstrate no significant findings. Lungs/Pleura: No pneumothorax is noted. Emphysematous disease is noted throughout both lungs. Right upper lobe airspace opacity is noted concerning for pneumonia. Minimal right posterior basilar subsegmental atelectasis is noted. Large calcified granuloma is noted in left posterior costophrenic sulcus. 4 mm right upper lobe nodule is noted best seen on image number 69 of series 5. Upper Abdomen: Mild splenomegaly is noted. Musculoskeletal: No chest wall abnormality. No acute or significant osseous findings. Review of the MIP images confirms the above findings. IMPRESSION: Acute pulmonary embolus is noted in peripheral lower lobe branches of the right pulmonary artery. Critical Value/emergent results were called by telephone at the time of interpretation on 10/28/2022 at 2:50 pm to provider [facility] , who verbally acknowledged these results. Right upper lobe airspace opacity is noted concerning for possible pneumonia. 4 mm nodule noted in right upper lobe. Although likely benign, if the patient is high-risk, given the morphology and/or location of this nodule a non-contrast chest CT can be considered in 12 months.This recommendation follows the consensus statement: Guidelines for Management of Incidental Pulmonary Nodules Detected on CT Images: From the Fleischner Society 2017; Radiology 2017; 284:228-243. Status post coronary artery bypass graft. Mild splenomegaly. Aortic Atherosclerosis (ICD10-I70.0) and Emphysema (ICD10-J43.9). Electronically Signed   By: Lupita Raider M.D.   On: 10/28/2022 14:51   IR BONE MARROW BIOPSY & ASPIRATION  Result Date: 10/28/2022 INDICATION: Leukocytosis EXAM: Bone  marrow aspiration and core biopsy using fluoroscopic guidance MEDICATIONS: None. ANESTHESIA/SEDATION: Moderate (conscious) sedation was employed during this procedure. A total of Versed 1 mg and Fentanyl 100 mcg was administered intravenously. Moderate Sedation Time: 10 minutes. The patient's level of consciousness and vital signs were monitored continuously by radiology nursing throughout the procedure under my direct supervision. FLUOROSCOPY TIME:  Fluoroscopy Time: 0.6 minutes (5 mGy) COMPLICATIONS: None immediate. PROCEDURE: Informed written consent was obtained from the patient after a thorough discussion of the procedural risks, benefits and alternatives. All questions were addressed. Maximal Sterile Barrier Technique was utilized including caps, mask, sterile gowns, sterile gloves, sterile drape, hand hygiene and skin antiseptic. A timeout was performed prior to the initiation of the procedure. The patient was placed prone on the IR exam table. Limited fluoroscopy of the pelvis was performed for planning purposes. Skin entry site was marked, and the overlying skin was prepped and draped in the standard sterile fashion. Local analgesia was obtained with 1% lidocaine. Using fluoroscopic guidance, an 11 gauge needle was advanced just deep to the cortex of the right posterior ilium. Subsequently, bone marrow aspiration and core biopsy were performed. Specimens were submitted to lab/pathology for handling. Hemostasis was achieved with manual pressure, and a clean dressing was placed. The patient tolerated the procedure well without immediate complication. IMPRESSION: Successful bone marrow aspiration and core biopsy of the right posterior ilium. Electronically Signed   By: Olive Bass M.D.   On: 10/28/2022 13:17   PERIPHERAL VASCULAR CATHETERIZATION  Result Date: 10/28/2022 See surgical note for result.  US Venous Img Lower Right (DVT Study)  Result Date: 10/27/2022 CLINICAL DATA:  Calf pain EXAM: Right  LOWER EXTREMITY VENOUS DOPPLER ULTRASOUND TECHNIQUE: Gray-scale sonography with compression, as well as color and duplex ultrasound, were performed to evaluate the deep venous system(s) from the level of the common femoral vein through the popliteal and proximal calf veins. COMPARISON:  None Available. FINDINGS: VENOUS Extensive acute occlusive DVT is noted in right lower extremity involving common femoral, femoral, popliteal, posterior tibial  and peroneal veins. Limited views of the contralateral common femoral vein are unremarkable. OTHER None. Limitations: none IMPRESSION: Extensive occlusive acute DVT is noted in major deep veins in right lower extremity. Electronically Signed   By: Ernie Avena M.D.   On: 10/27/2022 12:56   MR LUMBAR SPINE WO CONTRAST  Result Date: 10/27/2022 CLINICAL DATA:  Low back and right leg pain.  Remote back surgery. EXAM: MRI LUMBAR SPINE WITHOUT CONTRAST TECHNIQUE: Multiplanar, multisequence MR imaging of the lumbar spine was performed. No intravenous contrast was administered. COMPARISON:  Lumbar spine radiographs 09/12/2016. Lumbar MRI from [facility] 10/03/2016. Abdominopelvic CT 12/13/2011. FINDINGS: Segmentation: Transitional lumbosacral anatomy. In keeping with the numbering applied to the previous study, there is a transitional, nearly fully sacralized L5 segment. Alignment: Mild convex left scoliosis. Minimal retrolisthesis at L4-5. Vertebrae: No worrisome osseous lesion, acute fracture or pars defect. Chronic endplate degenerative changes at L3-4 and L4-5. Conus medullaris: Extends to the L1 level and appears normal. Paraspinal and other soft tissues: No significant paraspinal findings. 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 A chronic peripherally calcified collection anterolateral to the right psoas muscle appears unchanged. There is a heterogeneous sebaceous cyst along the posterior left lower back  which appears unchanged. Disc levels: Sagittal images demonstrate no significant disc space findings within the visualized lower thoracic spine. L1-2: Preserved disc height. There is mild disc bulging with a shallow left foraminal disc protrusion. Mild left foraminal narrowing without definite L1 nerve root encroachment. L2-3: Mild loss of disc height with mild disc bulging, facet and ligamentous hypertrophy. No significant spinal stenosis or nerve root encroachment. L3-4: Chronic loss of disc height with annular disc bulging and endplate osteophytes asymmetric to the right. Mild facet and ligamentous hypertrophy. The spinal canal and lateral recesses are patent. Mild right foraminal narrowing appears stable. L4-5: Remote right hemilaminectomy. Chronic loss of disc height with annular disc bulging and endplate osteophytes. Mild bilateral facet hypertrophy. Stable mild scarring in the right lateral recess. The spinal canal and left lateral recess are widely patent. Unchanged mild foraminal narrowing bilaterally. L5-S1: As numbered, this disc space is transitional without acquired abnormality. IMPRESSION: 1. Transitional lumbosacral anatomy. In keeping with the numbering applied to the previous MRI, the transitional segment is nearly fully sacralized at L5. 2. No acute findings or clear explanation for the patient's symptoms. 3. Stable postsurgical changes on the right at L4-5 with mild scarring in the right lateral recess and mild foraminal narrowing bilaterally. 4. Stable mild right foraminal narrowing at L3-4 due to asymmetric disc bulging and endplate osteophytes. 5. Stable chronic peripherally calcified collection anterolateral to the right psoas muscle. Electronically Signed   By: Carey Bullocks M.D.   On: 10/27/2022 11:52   CT TIBIA FIBULA RIGHT W CONTRAST  Result Date: 10/27/2022 CLINICAL DATA:  Soft tissue infection suspected, lower leg. Right posterior leg pain and swelling for 1 month. EXAM: CT OF THE  LOWER RIGHT EXTREMITY WITH CONTRAST TECHNIQUE: Multidetector CT imaging of the right lower leg was performed according to the standard protocol following intravenous contrast administration. RADIATION DOSE REDUCTION: This exam was performed according to the departmental dose-optimization program which includes automated exposure control, adjustment of the mA and/or kV according to patient size and/or use of iterative reconstruction technique. CONTRAST:  OMNIPAQUE IOHEXOL 300 MG/ML  SOLN COMPARISON:  None Available. FINDINGS: Bones/Joint/Cartilage No evidence of acute fracture or dislocation. The right tibia and fibula appear normal. No significant arthropathy or  effusion identified at the right knee or ankle. Ligaments Suboptimally assessed by CT. Muscles and Tendons No muscular abnormalities are identified in the lower leg. Specifically, no evidence of intramuscular fluid collection, atrophy or suspicious enhancement. The patellar tendon appears normal. The quadriceps tendon is incompletely visualized, although appears intact. The ankle tendons appear intact. Soft tissues This study was performed with intravenous contrast during the arterial phase of contrast administration. There is popliteal and runoff atherosclerosis without evidence of large vessel arterial occlusion. No significant venous opacification is demonstrated. The popliteal vein appears distended with central low-density, findings which are suspicious for deep venous thrombosis. No other significant soft tissue abnormalities are identified. IMPRESSION: 1. No acute osseous findings or significant arthropathy identified in the right lower leg. 2. No evidence of intramuscular fluid collection, atrophy or suspicious enhancement. 3. The popliteal vein appears distended with central low-density, suspicious for deep venous thrombosis. This is suboptimally evaluated by this examination performed during the arterial phase of contrast administration.  Patient is already scheduled for venous doppler ultrasound today. 4. Atherosclerosis without evidence of large vessel arterial occlusion. Electronically Signed   By: Carey Bullocks M.D.   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  Awaiting next generation sequencing and cytogenetics for confirmation of diagnosis.  Patient was given a referral to [facility] for second opinion.  Follow-up will be based on cytogenetics results.   DVT: Patient does not have any transient risk factors and unclear if it is related to underlying MDS.  Have recommended a minimum of 6 months of Eliquis 5 mg twice daily.  Can consider full hypercoagulable workup in the future. Leukocytosis: Likely secondary to underlying MDS.  Monitor. Thrombocytopenia: Likely secondary to underlying MDS. Weakness: Patient was given a referral for physical therapy.  Patient expressed understanding and was in agreement with this plan. He also understands that He can call clinic at any time with any questions, concerns, or complaints.    Cancer Staging  No matching staging information was found for the patient.  Jeralyn Ruths, MD   11/03/2022 1:16 PM

## 2022-11-03 NOTE — Progress Notes (Signed)
Critical value 10% blase and WBC 54, MD aware and notified

## 2022-11-04 MED ORDER — APIXABAN 5 MG PO TABS: 5.0000 mg | ORAL_TABLET | Freq: Two times a day (BID) | ORAL | 5 refills | Status: DC

## 2022-11-04 NOTE — Addendum Note (Signed)
Addended by: Luz Lex on: 11/04/2022 11:34 AM   Modules accepted: Orders

## 2022-11-07 ENCOUNTER — Encounter (HOSPITAL_COMMUNITY): Payer: Self-pay

## 2022-11-07 LAB — COMP PANEL: LEUKEMIA/LYMPHOMA: Immunophenotypic Profile: 12

## 2022-11-15 ENCOUNTER — Encounter (HOSPITAL_COMMUNITY): Payer: Self-pay | Admitting: Oncology

## 2022-11-16 ENCOUNTER — Telehealth: Payer: Self-pay

## 2022-11-16 NOTE — Telephone Encounter (Signed)
NGS test faxed over to Dr. Billey Co at [facility]

## 2022-11-23 ENCOUNTER — Emergency Department
Admission: EM | Admit: 2022-11-23 | Discharge: 2022-11-28 | Disposition: E | Payer: Medicare Other | Attending: Emergency Medicine | Admitting: Emergency Medicine

## 2022-11-23 DIAGNOSIS — I469 Cardiac arrest, cause unspecified: Secondary | ICD-10-CM | POA: Diagnosis present

## 2022-11-23 DIAGNOSIS — J449 Chronic obstructive pulmonary disease, unspecified: Secondary | ICD-10-CM | POA: Insufficient documentation

## 2022-11-23 DIAGNOSIS — I2510 Atherosclerotic heart disease of native coronary artery without angina pectoris: Secondary | ICD-10-CM | POA: Insufficient documentation

## 2022-11-23 DIAGNOSIS — I10 Essential (primary) hypertension: Secondary | ICD-10-CM | POA: Diagnosis not present

## 2022-11-23 DIAGNOSIS — Z7901 Long term (current) use of anticoagulants: Secondary | ICD-10-CM | POA: Diagnosis not present

## 2022-11-23 DIAGNOSIS — Z955 Presence of coronary angioplasty implant and graft: Secondary | ICD-10-CM | POA: Insufficient documentation

## 2022-11-23 DIAGNOSIS — Z86718 Personal history of other venous thrombosis and embolism: Secondary | ICD-10-CM | POA: Insufficient documentation

## 2022-11-23 MED ORDER — EPINEPHRINE 1 MG/10ML IJ SOSY
PREFILLED_SYRINGE | INTRAMUSCULAR | Status: AC | PRN
  Administered 2022-11-23 (×5): 1 mg via INTRAVENOUS

## 2022-11-23 MED ORDER — CALCIUM CHLORIDE 10 % IV SOLN
INTRAVENOUS | Status: AC | PRN
  Administered 2022-11-23: 1 g via INTRAVENOUS

## 2022-11-23 MED ORDER — EPINEPHRINE 1 MG/10ML IJ SOSY
PREFILLED_SYRINGE | INTRAMUSCULAR | Status: AC | PRN
  Administered 2022-11-23: 1 mg via INTRAVENOUS

## 2022-11-28 NOTE — Progress Notes (Signed)
   2022/12/09 1500  Spiritual Encounters  Type of Visit Follow up  Care provided to: Pt and family  Conversation partners present during encounter Nurse  Referral source Code page  Reason for visit Code  OnCall Visit Yes  Spiritual Framework  Presenting Themes Courage hope and growth  Patient Stress Factors None identified  Family Stress Factors None identified  Interventions  Spiritual Care Interventions Made Established relationship of care and support  Intervention Outcomes  Outcomes Awareness around self/spiritual resourses;Patient family open to resources  Spiritual Care Plan  Spiritual Care Issues Still Outstanding No further spiritual care needs at this time (see row info)   Chaplain recvd page to speak with the pt's wife. Chaplain spoke with pt's wife and offered her compassionate words and encouraging words. Pt's wife was happy that the chaplain was there with her.

## 2022-11-28 NOTE — ED Notes (Signed)
Pt family members left bedside. Pt placed in body bag for morgue. Transport placed.

## 2022-11-28 NOTE — Code Documentation (Signed)
Time of death called by Md Modesto Charon. 1439

## 2022-11-28 NOTE — ED Provider Notes (Addendum)
[facility] Provider Note    Event Date/Time   First MD Initiated Contact with Patient 11-25-22 1423     (approximate)   History   Cardiac Arrest   HPI  Tommy [name] is a 72 y.o. male   Past medical history of COPD, HTN, CAD w prior CABG, AML not yet undergoing treatment, DVT w IVC filter & eliquis, here by EMS with cardiac arrest. EMS reports approximately 1 hour of cardiac arrest with heart rhythm VT and brief ROSC achieved in field in bradycardia but devolved into asystole. Asystole on arrival.   Another approximately 30-40 minutes of ACLS performed in ED with persistent asystole on monitor.   --- I spoke with wife when she arrived in the ED about his condition and she states that recently his WBC count has been increasing. He had complained about difficulty breathing this morning.  --        Physical Exam   Triage Vital Signs: ED Triage Vitals  Enc Vitals Group     BP 2022-11-25 1428 (!) 164/69     Pulse Rate 11/25/2022 1441 (!) 0     Resp Nov 25, 2022 1441 (!) 0     Temp --      Temp src --      SpO2 2022/11/25 1441 (!) 0 %     Weight --      Height --      Head Circumference --      Peak Flow --      Pain Score --      Pain Loc --      Pain Edu? --      Excl. in GC? --     Most recent vital signs: Vitals:   2022-11-25 1428 25-Nov-2022 1441  BP: (!) 164/69   Pulse:  (!) 0  Resp:  (!) 0  SpO2:  (!) 0%    General: Asystole, apneic, fixed pupils   ED Results / Procedures / Treatments   Labs (all labs ordered are listed, but only abnormal results are displayed) Labs Reviewed  BASIC METABOLIC PANEL  CBC WITH DIFFERENTIAL/PLATELET  TROPONIN I (HIGH SENSITIVITY)  TROPONIN I (HIGH SENSITIVITY)     PROCEDURES:  Critical Care performed: Yes, see critical care procedure note(s)  .Critical Care  Performed by: Pilar Jarvis, MD Authorized by: Pilar Jarvis, MD   Critical care provider statement:    Critical care time  (minutes):  45   Critical care was necessary to treat or prevent imminent or life-threatening deterioration of the following conditions:  Cardiac failure and circulatory failure   Critical care was time spent personally by me on the following activities:  Development of treatment plan with patient or surrogate, discussions with consultants, evaluation of patient's response to treatment, examination of patient, ordering and review of laboratory studies, ordering and review of radiographic studies, ordering and performing treatments and interventions, pulse oximetry, re-evaluation of patient's condition and review of old charts    MEDICATIONS ORDERED IN ED: Medications  EPINEPHrine (ADRENALIN) 1 MG/10ML injection (1 mg Intravenous Given 2022-11-25 1419)  EPINEPHrine (ADRENALIN) 1 MG/10ML injection (1 mg Intravenous Given 11/25/22 1435)  calcium chloride injection (1 g Intravenous Given November 25, 2022 1423)     IMPRESSION / MDM / ASSESSMENT AND PLAN / ED COURSE  I reviewed the triage vital signs and the nursing notes.  Patient's presentation is most consistent with acute presentation with potential threat to life or bodily function.  Differential diagnosis includes, but is not limited to, cardiac arrest due to PE, ACS, respiratory arrest from COPD, leukostasis, tumor lysis  MDM:  Prolonged cardiac arrest w very brief rosc bradycardia by EMS. Arrived in asystole with down time approaching one hour on arrival; persistent asystole on ED evaluation approaching 2 hour total time in cardiac arrest.   Differential diagnosis as above, and multiple rounds as ACLS were performed with no change in cardiac activity. As more information about past medical history was elucidated from medical chart review and from his spouse, I considered VTE as an etiology of his arrest and considered thrombolytic therapy.   I then shared with his spouse Tommy [name] my concerns given his  out-of-hospital cardiac arrest, persistent asystole, and prolonged time of arrest that even in the unlikely event of achieving return of spontaneous circulation at this juncture, it would unlikely result in any sort of favorable neurologic outcome. With this in mind, the decision was made to cease further interventions and Tommy [name] was brought to the bedside to be with her husband as time of death was called at 2:39PM.         FINAL CLINICAL IMPRESSION(S) / ED DIAGNOSES   Final diagnoses:  Cardiac arrest [facility])     Rx / DC Orders   ED Discharge Orders     None        Note:  This document was prepared using Dragon voice recognition software and may include unintentional dictation errors.    Pilar Jarvis, MD 2022-12-04 [phone]

## 2022-11-28 NOTE — ED Triage Notes (Signed)
Pt arrived via ems from home due to witnessed cardiac arrest. According to EMS pt arrested in front of wife. EMS arrived and estimated code to have began at 1310. EMS states pt was in between several rhythms : asystole then in v-tach, defibrillation used, then asystole again, Sinus brady and transcutaneously paced, asystole again. Pt was asystole on monitor and pulseless when entered in ED room. CPR initiated. Pt cold and grey in appearance on arrival. Pt suctions per RT, blood in mouth.   Meds given PTA : 8 epi, 300mg  amiodarone, and 1L NS.  IO placed in RRL per EMS.

## 2022-11-28 NOTE — Progress Notes (Signed)
   12/18/22 1400  Spiritual Encounters  Type of Visit Initial  Care provided to: Patient  Conversation partners present during encounter Nurse  Referral source Code page  Reason for visit Code  OnCall Visit Yes   Chaplain answered page for code STEMI. When chaplain arrived their was no family present. Front desk told the chaplain they will page her when the family arrives.

## 2022-11-28 DEATH — deceased

## 2022-12-08 ENCOUNTER — Ambulatory Visit: Payer: Self-pay | Admitting: Internal Medicine

## 2023-04-04 NOTE — Telephone Encounter (Signed)
Signing encounter to close chart
